# Patient Record
Sex: Female | Born: 1965 | Race: Black or African American | Hispanic: No | Marital: Married | Smoking: Never smoker
Health system: Southern US, Community
[De-identification: ages and names within clinical notes are randomized; demographics above are authoritative.]

## PROBLEM LIST (undated history)

## (undated) DIAGNOSIS — E119 Type 2 diabetes mellitus without complications: Secondary | ICD-10-CM

## (undated) DIAGNOSIS — M5416 Radiculopathy, lumbar region: Secondary | ICD-10-CM

## (undated) DIAGNOSIS — I10 Essential (primary) hypertension: Secondary | ICD-10-CM

## (undated) DIAGNOSIS — G43909 Migraine, unspecified, not intractable, without status migrainosus: Secondary | ICD-10-CM

---

## 2011-02-18 ENCOUNTER — Other Ambulatory Visit: Payer: Self-pay | Admitting: Oncology

## 2011-06-25 ENCOUNTER — Other Ambulatory Visit: Payer: Self-pay | Admitting: *Deleted

## 2011-07-01 ENCOUNTER — Other Ambulatory Visit: Payer: Self-pay | Admitting: *Deleted

## 2012-12-25 ENCOUNTER — Emergency Department (HOSPITAL_COMMUNITY)
Admission: EM | Admit: 2012-12-25 | Discharge: 2012-12-25 | Disposition: A | Discharge status: Home / Self Care | Payer: BC Managed Care – PPO | Source: Home / Self Care | Attending: Family Medicine | Admitting: Family Medicine

## 2012-12-25 ENCOUNTER — Encounter (HOSPITAL_COMMUNITY): Payer: Self-pay

## 2012-12-25 DIAGNOSIS — G43909 Migraine, unspecified, not intractable, without status migrainosus: Secondary | ICD-10-CM

## 2012-12-25 DIAGNOSIS — J069 Acute upper respiratory infection, unspecified: Secondary | ICD-10-CM

## 2012-12-25 HISTORY — DX: Migraine, unspecified, not intractable, without status migrainosus: G43.909

## 2012-12-25 LAB — POCT RAPID STREP A: Streptococcus, Group A Screen (Direct): NEGATIVE

## 2012-12-25 MED ORDER — KETOROLAC TROMETHAMINE 30 MG/ML IJ SOLN
30.0000 mg | Freq: Once | INTRAMUSCULAR | Status: AC
  Administered 2012-12-25: 30 mg via INTRAMUSCULAR

## 2012-12-25 MED ORDER — DIPHENHYDRAMINE HCL 50 MG/ML IJ SOLN
25.0000 mg | Freq: Once | INTRAMUSCULAR | Status: AC
  Administered 2012-12-25: 25 mg via INTRAMUSCULAR

## 2012-12-25 NOTE — ED Notes (Signed)
C/o URI type syx since Saturday, no relief w OTC medications; wants strep test

## 2012-12-25 NOTE — ED Provider Notes (Signed)
CSN: [id number]     Arrival date & time 12/25/12  1014 History   First MD Initiated Contact with Patient 12/25/12 1215     Chief Complaint  Patient presents with   URI   (Consider location/radiation/quality/duration/timing/severity/associated sxs/prior Treatment) HPI Comments: Pt also c/o migraine, started yesterday, took own medicine which usually solves problem; it helped for a little while then headache came back.   Patient is a 47 y.o. female presenting with URI. The history is provided by the patient.  URI Presenting symptoms: congestion, cough, rhinorrhea and sore throat   Presenting symptoms: no ear pain and no fever   Severity:  Moderate Onset quality:  Gradual Duration:  2 days Timing:  Constant Progression:  Unchanged Chronicity:  New Relieved by:  Nothing Worsened by:  Nothing tried Ineffective treatments:  OTC medications Associated symptoms: headaches     Past Medical History  Diagnosis Date   Migraine    History reviewed. No pertinent past surgical history. History reviewed. No pertinent family history. History  Substance Use Topics   Smoking status: Never Smoker    Smokeless tobacco: Not on file   Alcohol Use: No   OB History   Grav Para Term Preterm Abortions TAB SAB Ect Mult Living                 Review of Systems  Constitutional: Positive for chills. Negative for fever.  HENT: Positive for congestion, sore throat and rhinorrhea. Negative for ear pain.   Eyes: Positive for photophobia.  Respiratory: Positive for cough. Negative for shortness of breath.   Neurological: Positive for headaches.    Allergies  Review of patient's allergies indicates not on file.  Home Medications   Current Outpatient Rx  Name  Route  Sig  Dispense  Refill   rizatriptan (MAXALT-MLT) 10 MG disintegrating tablet   Oral   Take 10 mg by mouth as needed for migraine. May repeat in 2 hours if needed          BP 132/79   Pulse 74   Temp(Src) 97.5 F (36.4  C) (Oral)   Resp 18   SpO2 100% Physical Exam  Constitutional: She appears well-developed and well-nourished.  Appears uncomfortable, sitting in dark.   HENT:  Right Ear: Tympanic membrane, external ear and ear canal normal.  Left Ear: Tympanic membrane, external ear and ear canal normal.  Nose: Mucosal edema and rhinorrhea present.  Mouth/Throat: Posterior oropharyngeal edema and posterior oropharyngeal erythema present. No oropharyngeal exudate.  Cerumen in both ear canals.   Cardiovascular: Normal rate and regular rhythm.   Pulmonary/Chest: Effort normal and breath sounds normal.  Lymphadenopathy:       Head (right side): No submental, no submandibular and no tonsillar adenopathy present.       Head (left side): No submental, no submandibular and no tonsillar adenopathy present.    She has no cervical adenopathy.    ED Course  Procedures (including critical care time) Labs Review Labs Reviewed  POCT RAPID STREP A ([facility] CARE ONLY)   Imaging Review No results found.  MDM   1. Migraine   2. URI (upper respiratory infection)    Given self care instructions for URI. Given toradol 30mg  IM and benadryl 25mg  IM here at [facility] for migraine.     Cathlyn Parsons, NP 12/25/12 [phone]

## 2012-12-26 NOTE — ED Provider Notes (Signed)
Medical screening examination/treatment/procedure(s) were performed by a resident physician or non-physician practitioner and as the supervising physician I was immediately available for consultation/collaboration.  Clementeen Graham, MD    Rodolph Bong, MD 12/26/12 [phone]

## 2012-12-27 LAB — CULTURE, GROUP A STREP

## 2021-03-18 ENCOUNTER — Emergency Department: Payer: BC Managed Care – PPO

## 2021-03-18 ENCOUNTER — Other Ambulatory Visit: Payer: Self-pay

## 2021-03-18 ENCOUNTER — Emergency Department
Admission: EM | Admit: 2021-03-18 | Discharge: 2021-03-18 | Disposition: A | Discharge status: Home / Self Care | Payer: BC Managed Care – PPO | Attending: Emergency Medicine | Admitting: Emergency Medicine

## 2021-03-18 ENCOUNTER — Encounter: Payer: Self-pay | Admitting: Emergency Medicine

## 2021-03-18 DIAGNOSIS — Z91040 Latex allergy status: Secondary | ICD-10-CM | POA: Insufficient documentation

## 2021-03-18 DIAGNOSIS — K2100 Gastro-esophageal reflux disease with esophagitis, without bleeding: Secondary | ICD-10-CM | POA: Insufficient documentation

## 2021-03-18 LAB — CBC
HCT: 40.5 % (ref 36.0–46.0)
Hemoglobin: 13.1 g/dL (ref 12.0–15.0)
MCH: 27.6 pg (ref 26.0–34.0)
MCHC: 32.3 g/dL (ref 30.0–36.0)
MCV: 85.4 fL (ref 80.0–100.0)
Platelets: 715 10*3/uL — ABNORMAL HIGH (ref 150–400)
RBC: 4.74 MIL/uL (ref 3.87–5.11)
RDW: 14.3 % (ref 11.5–15.5)
WBC: 10.6 10*3/uL — ABNORMAL HIGH (ref 4.0–10.5)
nRBC: 0 % (ref 0.0–0.2)

## 2021-03-18 LAB — HEPATIC FUNCTION PANEL
ALT: 36 U/L (ref 0–44)
AST: 33 U/L (ref 15–41)
Albumin: 4.1 g/dL (ref 3.5–5.0)
Alkaline Phosphatase: 120 U/L (ref 38–126)
Bilirubin, Direct: 0.1 mg/dL (ref 0.0–0.2)
Indirect Bilirubin: 0.5 mg/dL (ref 0.3–0.9)
Total Bilirubin: 0.6 mg/dL (ref 0.3–1.2)
Total Protein: 8.5 g/dL — ABNORMAL HIGH (ref 6.5–8.1)

## 2021-03-18 LAB — BASIC METABOLIC PANEL
Anion gap: 10 (ref 5–15)
BUN: 16 mg/dL (ref 6–20)
CO2: 22 mmol/L (ref 22–32)
Calcium: 10.4 mg/dL — ABNORMAL HIGH (ref 8.9–10.3)
Chloride: 101 mmol/L (ref 98–111)
Creatinine, Ser: 1.02 mg/dL — ABNORMAL HIGH (ref 0.44–1.00)
GFR, Estimated: >60 mL/min (ref >60)
Glucose, Bld: 133 mg/dL — ABNORMAL HIGH (ref 70–99)
Potassium: 4 mmol/L (ref 3.5–5.1)
Sodium: 133 mmol/L — ABNORMAL LOW (ref 135–145)

## 2021-03-18 LAB — TROPONIN I (HIGH SENSITIVITY)
Troponin I (High Sensitivity): 5 ng/L (ref <18)
Troponin I (High Sensitivity): 5 ng/L (ref <18)

## 2021-03-18 LAB — LIPASE, BLOOD: Lipase: 23 U/L (ref 11–51)

## 2021-03-18 IMAGING — CT CT ABD-PELV W/ CM
2 of 5 series · 16 of 46 positions shown, 18 images · IV contrast (APPLIED)
Comparison: None.

CLINICAL DATA: Generalized abdominal pain with nausea and vomiting

EXAM:
CT ABDOMEN AND PELVIS WITH CONTRAST
TECHNIQUE: Multidetector CT imaging of the abdomen and pelvis was performed
using the standard protocol following bolus administration of
intravenous contrast.
CONTRAST:  100mL OMNIPAQUE IOHEXOL 300 MG/ML  SOLN

[Series 2: routine abd/pel with · axial · 0.93mm/px · z∈[-1134,-688]mm · 13 of 101 slices shown, 15 images]
[im 6/101  soft-tissue]
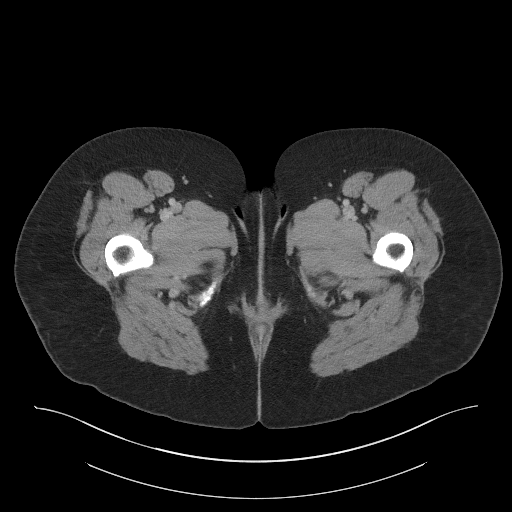
[im 6/101  bone]
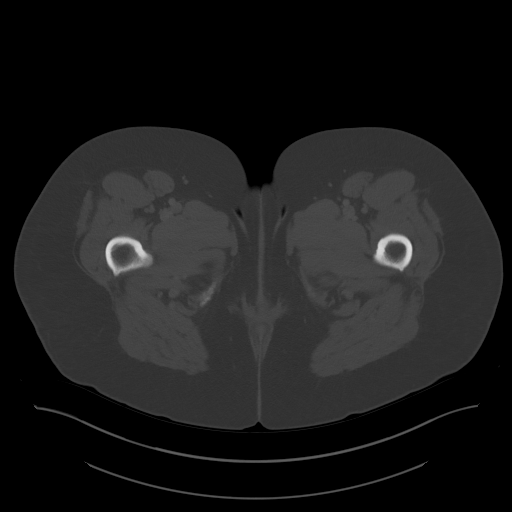
[im 16/101  soft-tissue]
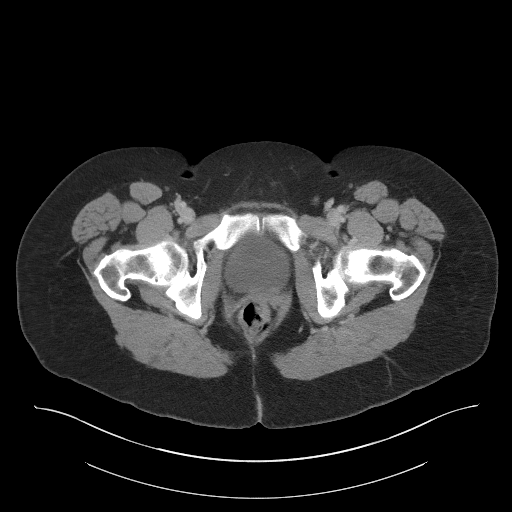
[im 22/101  soft-tissue]
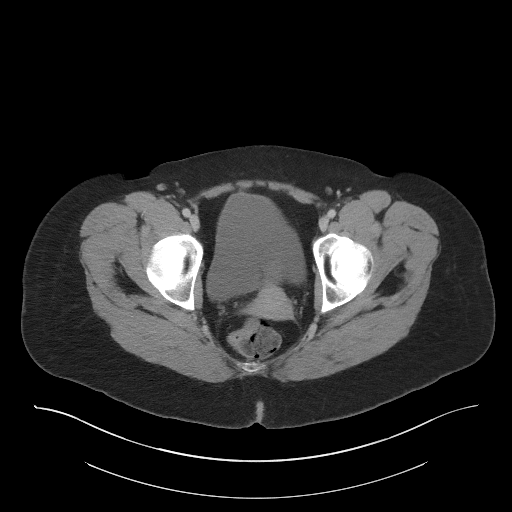
[im 27/101  soft-tissue]
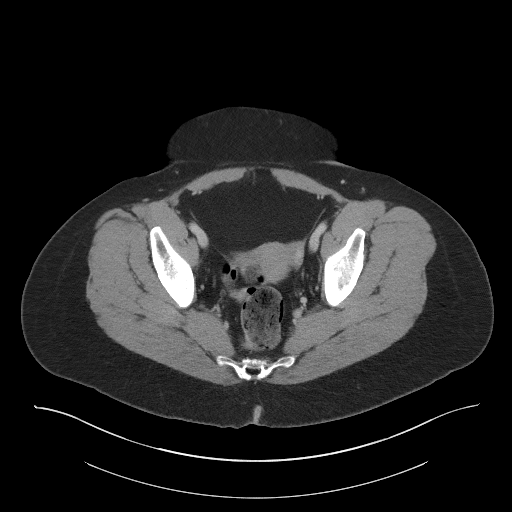
[im 37/101  soft-tissue]
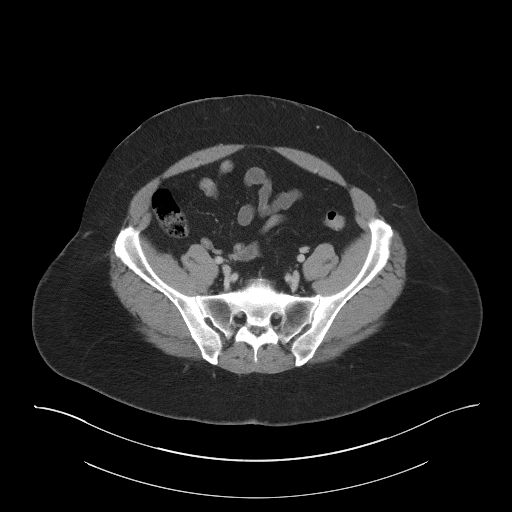
[im 43/101  soft-tissue]
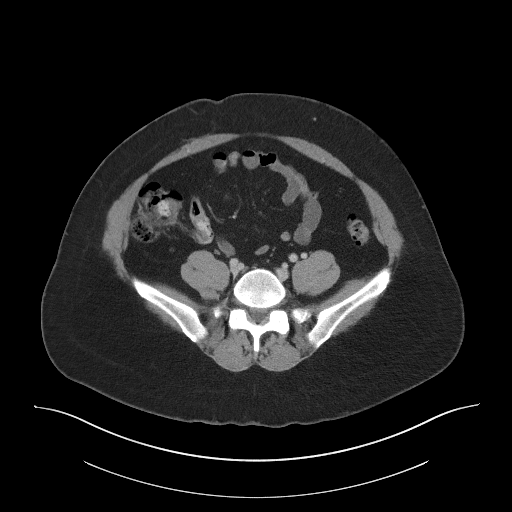
[im 53/101  soft-tissue]
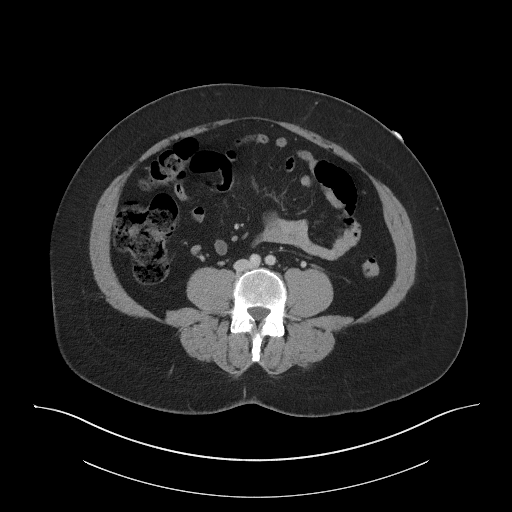
[im 58/101  soft-tissue]
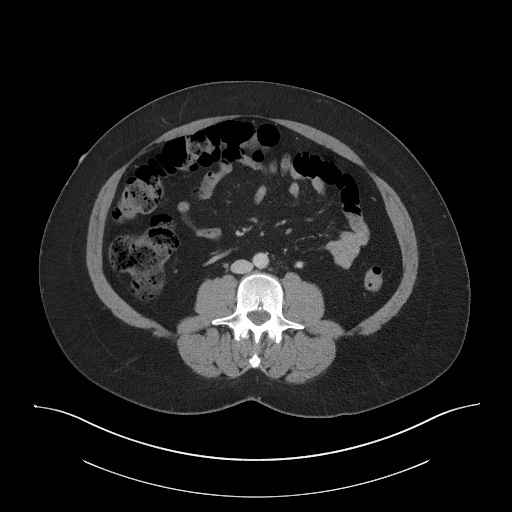
[im 64/101  soft-tissue]
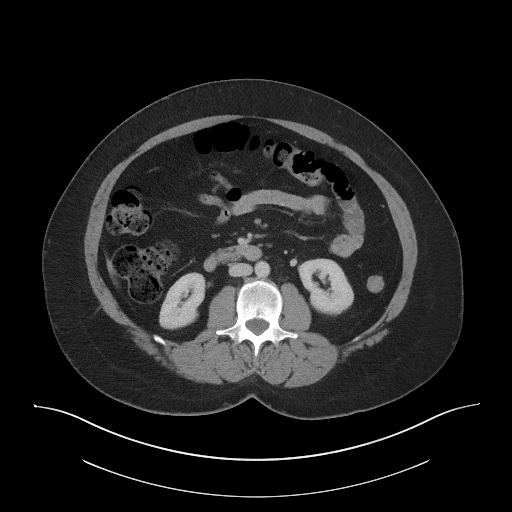
[im 64/101  bone]
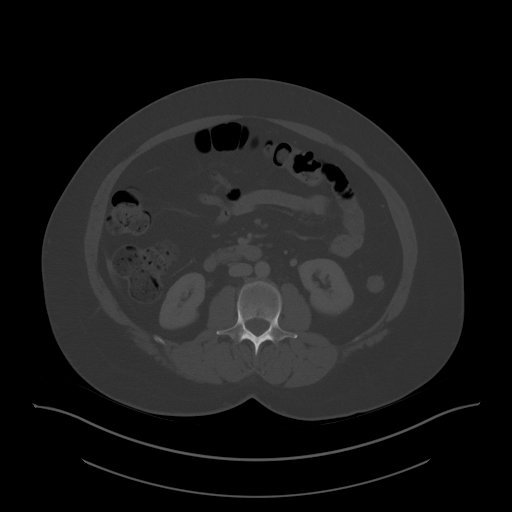
[im 74/101  soft-tissue]
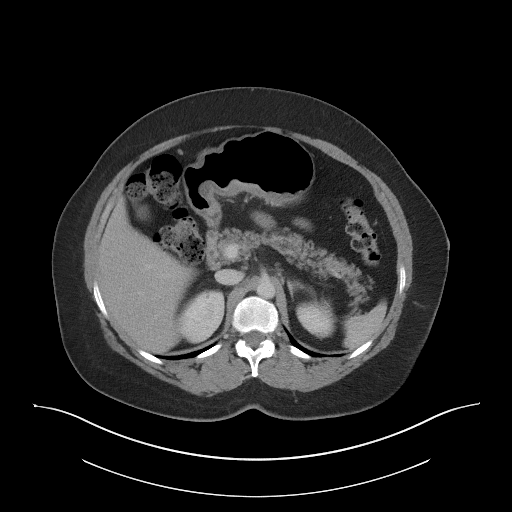
[im 79/101  soft-tissue]
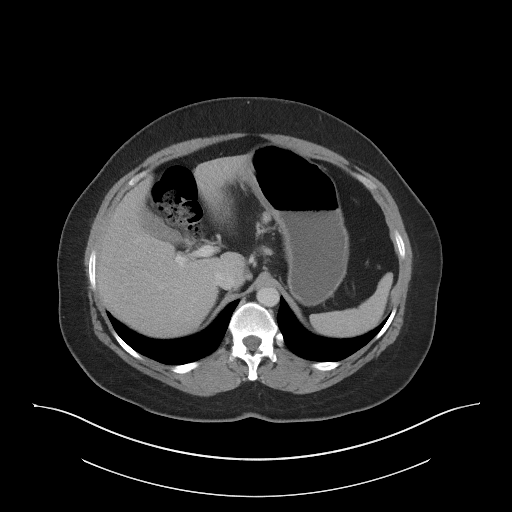
[im 85/101  soft-tissue]
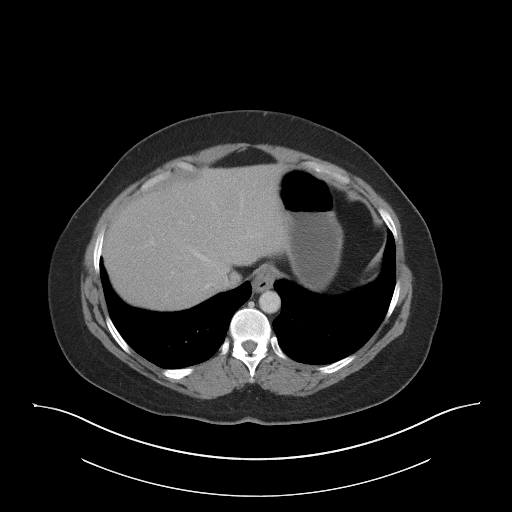
[im 95/101  soft-tissue]
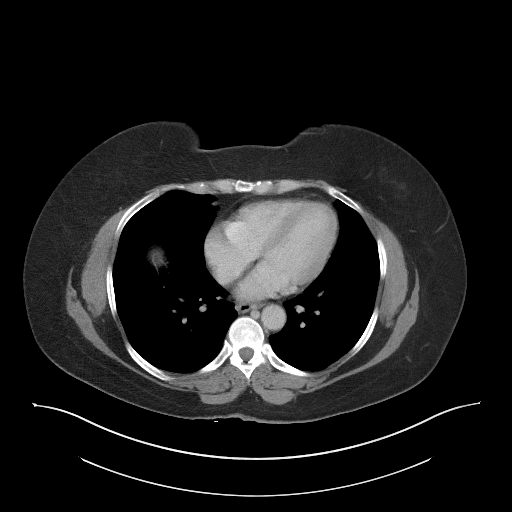

[Series 5: coronal st · coronal · 0.89mm/px · 3 of 101 slices shown]
[im 34/101  soft-tissue]
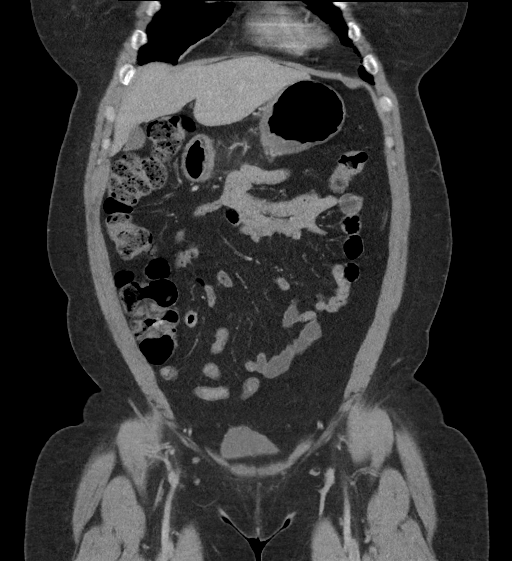
[im 45/101  soft-tissue]
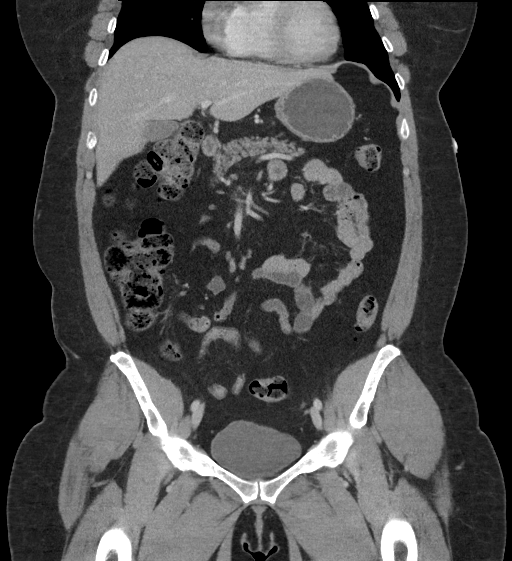
[im 56/101  soft-tissue]
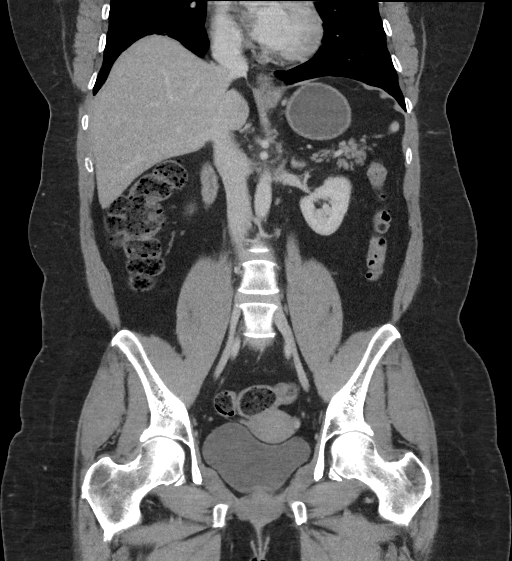

[16 of 46 positions shown; findings below may reference images not displayed]

FINDINGS: Lower chest: No acute abnormality.

Hepatobiliary: No suspicious hepatic lesion. Gallbladder is
unremarkable. No biliary ductal dilation.

Pancreas: No pancreatic ductal dilation or evidence of acute
inflammation.

Spleen: Normal in size without focal abnormality.

Adrenals/Urinary Tract: Adrenal glands are unremarkable. Kidneys are
normal, without renal calculi, solid enhancing lesion, or
hydronephrosis. Bladder is unremarkable.

Stomach/Bowel: No enteric contrast was administered. Mild symmetric
wall thickening of the distal esophagus which may reflect
esophagitis. Small hiatal hernia otherwise the stomach is
unremarkable for degree of distension. No pathologic dilation of
small or large bowel. Terminal ileum is unremarkable. The appendix
is not confidently identified however there is no pericecal
inflammation. Colonic diverticulosis without findings of acute
diverticulitis.

Vascular/Lymphatic: Scattered aortic and branch vessel
atherosclerosis without abdominal aortic aneurysm. No pathologically
enlarged abdominal or pelvic lymph nodes.

Reproductive: Uterus and bilateral adnexa are unremarkable.

Other: No significant abdominopelvic free fluid. No
pneumoperitoneum.

Musculoskeletal: No acute or significant osseous findings.
IMPRESSION: 1. Mild symmetric wall thickening of the distal esophagus may
reflect esophagitis.
2. Small hiatal hernia.
3. Colonic diverticulosis without findings of acute diverticulitis.
4.  Aortic Atherosclerosis ([CS]-[CS]).

## 2021-03-18 MED ORDER — LIDOCAINE VISCOUS HCL 2 % MT SOLN: 15.0000 mL | Freq: Two times a day (BID) | OROMUCOSAL | 2 refills | Status: DC | PRN

## 2021-03-18 MED ORDER — SODIUM CHLORIDE 0.9 % IV BOLUS
1000.0000 mL | Freq: Once | INTRAVENOUS | Status: AC
  Administered 2021-03-18: 1000 mL via INTRAVENOUS

## 2021-03-18 MED ORDER — ONDANSETRON HCL 4 MG/2ML IJ SOLN
4.0000 mg | Freq: Once | INTRAMUSCULAR | Status: AC
  Administered 2021-03-18: 4 mg via INTRAVENOUS
  Filled 2021-03-18: qty 2

## 2021-03-18 MED ORDER — MORPHINE SULFATE (PF) 4 MG/ML IV SOLN
4.0000 mg | Freq: Once | INTRAVENOUS | Status: AC
  Administered 2021-03-18: 4 mg via INTRAVENOUS
  Filled 2021-03-18: qty 1

## 2021-03-18 MED ORDER — LIDOCAINE VISCOUS HCL 2 % MT SOLN
15.0000 mL | Freq: Once | OROMUCOSAL | Status: AC
  Administered 2021-03-18: 15 mL via ORAL
  Filled 2021-03-18: qty 15

## 2021-03-18 MED ORDER — PANTOPRAZOLE SODIUM 40 MG PO TBEC
40.0000 mg | DELAYED_RELEASE_TABLET | Freq: Once | ORAL | Status: AC
  Administered 2021-03-18: 40 mg via ORAL
  Filled 2021-03-18: qty 1

## 2021-03-18 MED ORDER — IOHEXOL 300 MG/ML  SOLN
100.0000 mL | Freq: Once | INTRAMUSCULAR | Status: AC | PRN
  Administered 2021-03-18: 100 mL via INTRAVENOUS
  Filled 2021-03-18: qty 100

## 2021-03-18 MED ORDER — FAMOTIDINE IN NACL 20-0.9 MG/50ML-% IV SOLN
20.0000 mg | Freq: Once | INTRAVENOUS | Status: AC
  Administered 2021-03-18: 20 mg via INTRAVENOUS
  Filled 2021-03-18: qty 50

## 2021-03-18 MED ORDER — ALUM & MAG HYDROXIDE-SIMETH 200-200-20 MG/5ML PO SUSP
30.0000 mL | Freq: Once | ORAL | Status: AC
  Administered 2021-03-18: 30 mL via ORAL
  Filled 2021-03-18: qty 30

## 2021-03-18 MED ORDER — OMEPRAZOLE 10 MG PO CPDR: 10.0000 mg | DELAYED_RELEASE_CAPSULE | Freq: Every day | ORAL | 6 refills | Status: DC

## 2021-03-18 MED ORDER — FAMOTIDINE 20 MG PO TABS: 20.0000 mg | ORAL_TABLET | Freq: Every day | ORAL | 0 refills | Status: DC

## 2021-03-18 NOTE — ED Triage Notes (Signed)
Pt comes into the ED via POV c/o generalized abd pain, N/V that started last Thursday.  Pt in NAD at this time.

## 2021-03-18 NOTE — ED Provider Notes (Signed)
[facility] Emergency Department Provider Note  ____________________________________________  Time seen: Approximately 7:22 PM  I have reviewed the triage vital signs and the nursing notes.   HISTORY  Chief Complaint Abdominal Pain and Emesis    HPI Kelly Long is a 55 y.o. female who presents to the ED for sharp epigastric abdominal pain, nausea and vomiting.  Symptoms have been ongoing x7 days.  No fevers, congestion, sore throat.  Patient is having sharp abdominal pain.  She still has her gallbladder but does not have her appendix.  No history of GI issues to include IBS, Crohn's or colitis.  Patient states that she is vomiting up to 10 times a day.       Past Medical History:  Diagnosis Date   Migraine     There are no problems to display for this patient.   History reviewed. No pertinent surgical history.  Prior to Admission medications   Medication Sig Start Date End Date Taking? Authorizing Provider  famotidine (PEPCID) 20 MG tablet Take 1 tablet (20 mg total) by mouth daily. 03/18/21 03/18/22 Yes Keriana Sarsfield, Delorise Royals, PA-C  GI Cocktail (alum & mag hydroxide-simethicone/lidocaine)oral mixture Take 15 mLs by mouth 2 (two) times daily as needed (heartburn). 03/18/21  Yes Honor Fairbank, Delorise Royals, PA-C  omeprazole (PRILOSEC) 10 MG capsule Take 1 capsule (10 mg total) by mouth daily. 03/18/21  Yes Jovanna Hodges, Delorise Royals, PA-C  rizatriptan (MAXALT-MLT) 10 MG disintegrating tablet Take 10 mg by mouth as needed for migraine. May repeat in 2 hours if needed    [provider]    Allergies Latex and Shellfish allergy  History reviewed. No pertinent family history.  Social History Social History   Tobacco Use   Smoking status: Never  Substance Use Topics   Alcohol use: No     Review of Systems  Constitutional: No fever/chills Eyes: No visual changes. No discharge ENT: No upper respiratory complaints. Cardiovascular: no chest  pain. Respiratory: no cough. No SOB. Gastrointestinal: Sharp epigastric abdominal pain with nausea and vomiting.  No diarrhea.  No constipation. Genitourinary: Negative for dysuria. No hematuria Musculoskeletal: Negative for musculoskeletal pain. Skin: Negative for rash, abrasions, lacerations, ecchymosis. Neurological: Negative for headaches, focal weakness or numbness.  10 System ROS otherwise negative.  ____________________________________________   PHYSICAL EXAM:  VITAL SIGNS: ED Triage Vitals  Enc Vitals Group     BP 03/18/21 1351 (!) 170/97     Pulse Rate 03/18/21 1351 (!) 124     Resp 03/18/21 1351 20     Temp 03/18/21 1351 98.6 F (37 C)     Temp Source 03/18/21 1351 Oral     SpO2 03/18/21 1351 99 %     Weight 03/18/21 1338 223 lb (101.2 kg)     Height 03/18/21 1338 5\' 4"  (1.626 m)     Head Circumference --      Peak Flow --      Pain Score 03/18/21 1337 9     Pain Loc --      Pain Edu? --      Excl. in GC? --      Constitutional: Alert and oriented. Well appearing and in no acute distress. Eyes: Conjunctivae are normal. PERRL. EOMI. Head: Atraumatic. ENT:      Ears:       Nose: No congestion/rhinnorhea.      Mouth/Throat: Mucous membranes are moist.  Neck: No stridor.   Cardiovascular: Normal rate, regular rhythm. Normal S1 and S2.  Good peripheral circulation.  Respiratory: Normal respiratory effort without tachypnea or retractions. Lungs CTAB. Good air entry to the bases with no decreased or absent breath sounds. Gastrointestinal: Bowel sounds 4 quadrants.  Soft to palpation all quadrant.  Patient is very tender to palpation in the epigastric and right upper quadrants.  Guarding in this region.  Positive for Murphy sign.. No guarding or rigidity. No palpable masses. No distention. No CVA tenderness. Musculoskeletal: Full range of motion to all extremities. No gross deformities appreciated. Neurologic:  Normal speech and language. No gross focal neurologic  deficits are appreciated.  Skin:  Skin is warm, dry and intact. No rash noted. Psychiatric: Mood and affect are normal. Speech and behavior are normal. Patient exhibits appropriate insight and judgement.   ____________________________________________   LABS (all labs ordered are listed, but only abnormal results are displayed)  Labs Reviewed  BASIC METABOLIC PANEL - Abnormal; Notable for the following components:      Result Value   Sodium 133 (*)    Glucose, Bld 133 (*)    Creatinine, Ser 1.02 (*)    Calcium 10.4 (*)    All other components within normal limits  CBC - Abnormal; Notable for the following components:   WBC 10.6 (*)    Platelets 715 (*)    All other components within normal limits  HEPATIC FUNCTION PANEL - Abnormal; Notable for the following components:   Total Protein 8.5 (*)    All other components within normal limits  LIPASE, BLOOD  POC URINE PREG, ED  TROPONIN I (HIGH SENSITIVITY)  TROPONIN I (HIGH SENSITIVITY)   ____________________________________________  EKG   ____________________________________________  RADIOLOGY I personally viewed and evaluated these images as part of my medical decision making, as well as reviewing the written report by the radiologist.  ED Provider Interpretation: Distal esophageal irritation, no other significant findings concerning for infection or obstruction on CT scan.  Small hiatal hernia.  CT ABDOMEN PELVIS W CONTRAST  Result Date: 03/18/2021 CLINICAL DATA:  Generalized abdominal pain with nausea and vomiting EXAM: CT ABDOMEN AND PELVIS WITH CONTRAST TECHNIQUE: Multidetector CT imaging of the abdomen and pelvis was performed using the standard protocol following bolus administration of intravenous contrast. CONTRAST:  160mL OMNIPAQUE IOHEXOL 300 MG/ML  SOLN COMPARISON:  None. FINDINGS: Lower chest: No acute abnormality. Hepatobiliary: No suspicious hepatic lesion. Gallbladder is unremarkable. No biliary ductal dilation.  Pancreas: No pancreatic ductal dilation or evidence of acute inflammation. Spleen: Normal in size without focal abnormality. Adrenals/Urinary Tract: Adrenal glands are unremarkable. Kidneys are normal, without renal calculi, solid enhancing lesion, or hydronephrosis. Bladder is unremarkable. Stomach/Bowel: No enteric contrast was administered. Mild symmetric wall thickening of the distal esophagus which may reflect esophagitis. Small hiatal hernia otherwise the stomach is unremarkable for degree of distension. No pathologic dilation of small or large bowel. Terminal ileum is unremarkable. The appendix is not confidently identified however there is no pericecal inflammation. Colonic diverticulosis without findings of acute diverticulitis. Vascular/Lymphatic: Scattered aortic and branch vessel atherosclerosis without abdominal aortic aneurysm. No pathologically enlarged abdominal or pelvic lymph nodes. Reproductive: Uterus and bilateral adnexa are unremarkable. Other: No significant abdominopelvic free fluid. No pneumoperitoneum. Musculoskeletal: No acute or significant osseous findings. IMPRESSION: 1. Mild symmetric wall thickening of the distal esophagus may reflect esophagitis. 2. Small hiatal hernia. 3. Colonic diverticulosis without findings of acute diverticulitis. 4.  Aortic Atherosclerosis (ICD10-I70.0). Electronically Signed   By: Dahlia Bailiff M.D.   On: 03/18/2021 20:04    ____________________________________________    PROCEDURES  Procedure(s) performed:  Procedures    Medications  famotidine (PEPCID) IVPB 20 mg premix (20 mg Intravenous New Bag/Given 03/18/21 2026)  ondansetron (ZOFRAN) injection 4 mg (4 mg Intravenous Given 03/18/21 1939)  morphine 4 MG/ML injection 4 mg (4 mg Intravenous Given 03/18/21 1939)  sodium chloride 0.9 % bolus 1,000 mL (1,000 mLs Intravenous New Bag/Given 03/18/21 1940)  iohexol (OMNIPAQUE) 300 MG/ML solution 100 mL (100 mLs Intravenous Contrast Given  03/18/21 1951)  alum & mag hydroxide-simeth (MAALOX/MYLANTA) 200-200-20 MG/5ML suspension 30 mL (30 mLs Oral Given 03/18/21 2025)    And  lidocaine (XYLOCAINE) 2 % viscous mouth solution 15 mL (15 mLs Oral Given 03/18/21 2025)  pantoprazole (PROTONIX) EC tablet 40 mg (40 mg Oral Given 03/18/21 2025)     ____________________________________________   INITIAL IMPRESSION / ASSESSMENT AND PLAN / ED COURSE  Pertinent labs & imaging results that were available during my care of the patient were reviewed by me and considered in my medical decision making (see chart for details).  Review of the [place] CSRS was performed in accordance of the [place] prior to dispensing any controlled drugs.           Patient's diagnosis is consistent with GERD/gastritis.  Patient presented with sharp epigastric pain, nausea vomiting times a week.  No fevers.  No bilious vomit.  No diarrhea or constipation.  Patient was tender in the epigastric region.  Labs are overall reassuring.  No elevation in lipase or LFTs.  CT scan revealed distal esophageal irritation, small hiatal hernia.  I will treat symptomatically with famotidine, Protonix and GI cocktail.. Patient will be discharged home with prescriptions for GI cocktail, Protonix, famotidine.  If symptoms do not improve follow-up primary care or GI.Marland Kitchen  Patient is given ED precautions to return to the ED for any worsening or new symptoms.     ____________________________________________  FINAL CLINICAL IMPRESSION(S) / ED DIAGNOSES  Final diagnoses:  Gastroesophageal reflux disease with esophagitis without hemorrhage      NEW MEDICATIONS STARTED DURING THIS VISIT:  ED Discharge Orders          Ordered    omeprazole (PRILOSEC) 10 MG capsule  Daily        03/18/21 2031    famotidine (PEPCID) 20 MG tablet  Daily        03/18/21 2031    GI Cocktail (alum & mag hydroxide-simethicone/lidocaine)oral mixture  2 times daily PRN        03/18/21 2031                 This chart was dictated using voice recognition software/Dragon. Despite best efforts to proofread, errors can occur which can change the meaning. Any change was purely unintentional.    Darletta Moll, PA-C 03/18/21 2031    Vanessa Bennett, MD 03/18/21 2126

## 2022-02-24 NOTE — Progress Notes (Signed)
 Outpatient Neurology Clinic Note   [facility] [address] [facility] NEUROLOGY CLINIC [address] [place] [address] [place] [address] [phone] Date: 02/23/2022 Patient Name: Kelly Long MRN: [id number] PCP: Joyceann Michal JONELLE Joyceann Michal JONELLE, MD   Assessment and Plan   Kelly Long is a 56 y.o. female with a past medical history of GERD, OSA, and arthritis presenting in follow-up for evaluation and treatments of headaches.  # Tension headaches  Ice Pick headaches Intractable, daily, tension-type headaches involving bitemporal, band-like pain and pressure. Tension headaches also have migrainous features at times with photophobia, phonophobia, and nausea. Also has ice pick, stabbing headaches. Suspect a component of rebound headaches given daily Excedrin use. Referral was sent to the [facility] given intractable headaches while on: amitriptyline 100 mg nightly, indomethacin 75 mg BID, Tizanidine 8 mg nightly, Verapamil 120 mg, and PRN Imitrex  50 mg.   Plan:  - Referral sent to [facility]  - Please avoid using Excedrin or other over the counter medications to prevent rebound headaches - Discontinue Imitrex  and start using Ubrelvy PRN for abortive therapy (do not exceed more than 200 mg in one day) - Take Reglan PRN for nausea  - Continue to take amitriptyline 100 mg nightly, indomethacin 75 mg BID, Tizanidine 8 mg nightly, Verapamil 120 mg as previously prescribed   Return Visit Discussed: Return in about 3 months (around 05/26/2022).  This patient was discussed with Dr. Marci who agrees with the above assessment and plan.  I personally spent 45 minutes face-to-face and non-face-to-face in the care of this patient, which includes all pre, intra, and post visit time on the date of service.  Louretta Shearing, MD PGY-2, Neurology [facility] Department of Neurology   HPI     HPI: Kelly Long is a 56 y.o. female seen  in consultation at the [facility] at the request of Dr. Joyceann for evaluation of headaches. They are requesting my opinion regarding potential etiologies, diagnostic work-up that should be considered, and/or treatment options entertained.   She was previously seen by Dr. Gordy and prescribed the following medications: amitriptyline 100 mg nightly, indomethacin 75 mg BID, Tizanidine 8 mg nightly, Verapamil 120 mg, and PRN Imitrex  50 mg. Additionally, she uses tylenol  and ibuprofen daily. Despite use of the above medications, she has daily headaches with worsening in the afternoon. Daily headaches involve the entire head, more prominent in the bitemporal region and are band-like. These headaches are associated with photophobia and phonophobia. 2-3 times per week, she also experiences nausea. These headaches also get severe 3-4 times a month and become intolerable.   In addition to the headache type above, she also has ice pick headaches, which she describes as stabbing headaches. She gets these headaches 3-4 times a months  Allergies  Allergen Reactions  . Latex Anaphylaxis  . Shellfish Containing Products Anaphylaxis     Current Outpatient Medications  Medication Sig Dispense Refill  . amitriptyline (ELAVIL) 50 MG tablet Take 2 tablets (100 mg total) by mouth nightly. 60 tablet 0  . indomethacin (INDOCIN SR) 75 mg CR capsule TAKE 1 CAP BY MOUTH 3 TIMES A DAY WITH MEALS 5-6 HOURS APART    . lidocaine  2% viscous (LIDOCAINE ) 2 % Soln 5 mL by Mouth route every four (4) hours as needed (spasms, chest pain). 300 mL 2  . multivit,calc,mins/iron/folic (WOMEN'S ONE DAILY ORAL) Take by mouth.    . omeprazole  (PRILOSEC) 40  MG capsule Take 1 capsule (40 mg total) by mouth daily. TAKE 1 CAPSULE (40 MG TOTAL) BY MOUTH DAILY.    . tizanidine (ZANAFLEX) 4 MG tablet Take 2 tablets (8 mg total) by mouth nightly. 60 tablet 0  . topiramate (TOPAMAX) 50 MG tablet Take 3  tablets (150 mg total) by mouth daily. 90 tablet 3  . verapamiL (CALAN-SR) 120 MG CR tablet TAKE 1 TABLET BY MOUTH TWICE A DAY 180 tablet 2  . fexofenadine (ALLEGRA) 180 MG tablet Take 1 tablet (180 mg total) by mouth daily.    . metoclopramide (REGLAN) 10 MG tablet Take 1 tablet (10 mg total) by mouth Three (3) times a day with a meal. 90 tablet 1  . ubrogepant (UBRELVY) 50 mg tablet Take 1 tablet (50 mg total) by mouth two (2) times a day as needed. 50 to 100 mg as a single dose; if symptoms persist or return, may repeat dose after =2 hours. Maximum: 200 mg per 24 hours 25 tablet 0   No current facility-administered medications for this visit.    Past Medical History:  Diagnosis Date  . Arthritis    right knee left shoulder and lower back  . GERD (gastroesophageal reflux disease)   . Migraine   . Migraine   . Nipple dermatitis 05/04/2015  . Rib pain 08/15/2013  . Snores     Past Surgical History:  Procedure Laterality Date  . APPENDECTOMY    . HYSTERECTOMY  1992   partial   . PR ALLOGRAFT FOR SPINE SURGERY ONLY MORSELIZED Midline 08/28/2015   Procedure: Allograft For Spine Surgery Only; Morselized;  Surgeon: Toribio Jama Clap, MD;  Location: MAIN OR [facility];  Service: Orthopedics  . PR ANTERIOR INSTRUMENTATION 2-3 VERTEBRAL SEGMENTS Midline 08/28/2015   Procedure: Ant Instrum; 2 To 3 Verteb Segmt Cervical;  Surgeon: Toribio Jama Clap, MD;  Location: MAIN OR [facility];  Service: Orthopedics  . PR ARTHRODESIS ANT INTERBODY INC DISCECTOMY, CERVICAL BELOW C2 Midline 08/28/2015   Procedure: Arthrodes, Ant Intrbdy, Incl Disc Spc Prep, Discect, Osteophyt/Decompress Spinl Crd &/Or Nrv Rt, Crv Blo C2;  Surgeon: Toribio Jama Clap, MD;  Location: MAIN OR [facility];  Service: Orthopedics  . PR ARTHRODESIS ANT INTERBODY INC DISCECTOMY, CERVICAL BELOW C2 EACH ADDL Midline 08/28/2015   Procedure: Arthrod, Ant Intbdy, Incl Disc Spc Prep/Disctmy/Ostephyt/Decmpr Spnl Crd/Nrv Rt; Cerv Belo C2, Ea Add`L Spc;   Surgeon: Toribio Jama Clap, MD;  Location: MAIN OR [facility];  Service: Orthopedics  . PR AUTOGRAFT SPINE SURGERY LOCAL FROM SAME INCISION Midline 08/28/2015   Procedure: Autograft/Spine Surg Only (W/Harvest Graft); Local (Eg, Rib/Spinous Proc, Lam Frgmt) Obtain From Same Incis;  Surgeon: Toribio Jama Clap, MD;  Location: MAIN OR [facility];  Service: Orthopedics  . PR COLONOSCOPY FLX DX W/COLLJ SPEC WHEN PFRMD Left 05/19/2015   Procedure: COLONOSCOPY, FLEXIBLE, PROXIMAL TO SPLENIC FLEXURE; DIAGNOSTIC, W/WO COLLECTION SPECIMEN BY BRUSH OR WASH;  Surgeon: Rodger Phlegm, MD;  Location: [facility] PROCEDURES [facility];  Service: Gastroenterology  . PR COLONOSCOPY FLX DX W/COLLJ SPEC WHEN PFRMD N/A 05/28/2021   Procedure: COLONOSCOPY, FLEXIBLE, PROXIMAL TO SPLENIC FLEXURE; DIAGNOSTIC, W/WO COLLECTION SPECIMEN BY BRUSH OR WASH;  Surgeon: Luke Cory Notch, MD;  Location: GI PROCEDURES [facility];  Service: Gastroenterology  . PR INSJ BIOMCHN DEV INTERVERTEBRAL DSC SPC W/ARTHRD N/A 08/28/2015   Procedure: Insert Interbody Biomechanical Device(S) With Integral Anterior Instrument For Device Anchoring, When Performed, To Intervertebral Disc Space In Conjunction With Interbody Arthrodesis, Each Interspace x2;  Surgeon: Toribio Jama Clap, MD;  Location: MAIN OR [facility];  Service: Orthopedics  . PR IONM 1 ON 1 IN OR W/ATTENDANCE EACH 15 MINUTES N/A 08/28/2015   Procedure: Continuous Intraoperative Neurophysiology Monitoring In Or;  Surgeon: Toribio Jama Clap, MD;  Location: MAIN OR [facility];  Service: Orthopedics  . PR MICROSURG TECHNIQUES,REQ OPER MICROSCOPE  08/28/2015   Procedure: Microsurgical Techniques, Requiring Use Of Operating Microscope (List Separately In Addition To Code For Primary Procedure);  Surgeon: Toribio Jama Clap, MD;  Location: MAIN OR [facility];  Service: Orthopedics  . PR UPPER GI ENDOSCOPY,BIOPSY N/A 05/28/2021   Procedure: UGI ENDOSCOPY; WITH BIOPSY, SINGLE OR MULTIPLE;  Surgeon: Luke Cory Notch, MD;   Location: GI PROCEDURES [facility];  Service: Gastroenterology  . TUBAL LIGATION      Social History   Socioeconomic History  . Marital status: Married    Spouse name: Machell Wirthlin  . Number of children: None  . Years of education: None  . Highest education level: None  Occupational History  . Occupation: Haematologist   Tobacco Use  . Smoking status: Never  . Smokeless tobacco: Never  Substance and Sexual Activity  . Alcohol use: No  . Drug use: No  Other Topics Concern  . Exercise No  . Living Situation No  Social History Narrative   Works at [employer] for OR, married with 3 kids, multple grandkids, husband is a education officer, environmental.    Social Determinants of Health   Financial Resource Strain: Medium Risk (10/07/2020)   Overall Financial Resource Strain (CARDIA)   . Difficulty of Paying Living Expenses: Somewhat hard  Food Insecurity: Food Insecurity Present (10/07/2020)   Hunger Vital Sign   . Worried About Programme Researcher, Broadcasting/film/video in the Last Year: Often true   . Ran Out of Food in the Last Year: Often true  Transportation Needs: Unmet Transportation Needs (10/07/2020)   PRAPARE - Transportation   . Lack of Transportation (Medical): Yes   . Lack of Transportation (Non-Medical): No    Family History  Problem Relation Age of Onset  . Breast cancer Mother        late 89's  . No Known Problems Father   . Breast cancer Sister 62  . No Known Problems Daughter   . Breast cancer Maternal Grandmother   . Colorectal Cancer Maternal Grandfather   . Colon cancer Maternal Grandfather   . No Known Problems Paternal Grandmother   . Colorectal Cancer Maternal Uncle   . Colon cancer Maternal Uncle   . Breast cancer Paternal Aunt   . Breast cancer Paternal Aunt   . Ovarian cancer Niece   . Breast cancer Other   . Anesthesia problems Neg Hx   . BRCA 1/2 Neg Hx   . Cancer Neg Hx   . Endometrial cancer Neg Hx   . Esophageal cancer Neg Hx   .  Hemochromatosis Neg Hx   . Irritable bowel syndrome Neg Hx   . Liver cancer Neg Hx   . Inflammatory bowel disease Neg Hx   . Pancreatic cancer Neg Hx   . Pancreatitis Neg Hx   . Stomach cancer Neg Hx   . Ulcerative colitis Neg Hx   . Crohn's disease Neg Hx   . Cirrhosis Neg Hx   . Cholelithiasis Neg Hx   . Celiac disease Neg Hx      Review of Systems  A 10-system review of systems was conducted and was negative except as documented above in the HPI  Objective  Vital signs: BP 148/80   Pulse 95   Ht 162.6 cm (5' 4.02)   Wt (!) 106.4 kg (234 lb 9.6 oz)   BMI 40.25 kg/m    Physical Exam: General Appearance: Appears in pain  HEENT: Head is atraumatic and normocephalic. Sclera anicteric without injection. Cardiopulmonary: 2+ peripheral pulses. Normal work of breathing on room air Abdomen: Non-tender, non-distended abdomen  Extremities:No clubbing, cyanosis, or edema  Skin:  No rashes, lesions on clothed exam  Neurological Examination:  Mental Status: Alert, fully oriented, answers questions appropriately. Attention and concentration grossly intact to interview and physical. Spontaneous speech was fluent without word finding pauses, dysarthria, or paraphasic errors. Comprehension was intact to simple and multi-step commands. Memory for recent and remote events was intact.  Cranial Nerves: Visual fields intact to direct confrontation.  PERRL. Pursuit eye movements were uninterrupted with full range and without more than end-gaze nystagmus. Facial sensation intact bilaterally to light touch in all three divisions of CNV. Face symmetric at rest. Normal facial movement bilaterally, including forehead, eye closure and grimace/smile. Hearing intact to conversation. Shoulder shrug full strength bilaterally. Palate movement is symmetric. Tongue protrudes midline and tongue movements are normal.  Motor Exam: Normal bulk. No tremors, myoclonus, or other adventitious movement. 5/5  strength in all extremities proximally and distally. Pronator drift is absent.  Reflexes: Deferred.  Sensory: Sensation normal to light touch in both hands and both feet  Cerebellar/Coordination/Gait: Finger-to-nose is normal without ataxia or dysmetria bilaterally. Gait exam demonstrates normal posture, base, stride length, arm swing and turns.   Diagnostic Studies and Review of Records  Labs: Risk Stratification:  Cholesterol (mg/dL)  Date Value  96/98/7977 225 (H)   Triglycerides (mg/dL)  Date Value  96/98/7977 103   HDL (mg/dL)  Date Value  96/98/7977 61 (H)   LDL Calculated (mg/dL)  Date Value  96/98/7977 143 (H)   TSH (uIU/mL)  Date Value  02/15/2022 1.356   HGB A1C, POC (%)  Date Value  05/02/2015 5.7   Hemoglobin A1C (%)  Date Value  02/15/2022 6.2 (H)    Imaging: - MRI cervical spine:  Multilevel degenerative changes along the cervical spine, as above, most prominent at C5-6. Tornwaldt's cyst, nasopharynx

## 2022-03-23 NOTE — Progress Notes (Signed)
 Attending physician attestation:   I discussed patient 's clinical presentation and relevant parts of the history and I reviewed and agreed with resident's note above.     Brad CHRISTELLA Boards, MD  Neurology

## 2022-12-08 NOTE — H&P (Signed)
 PRE-PROCEDURE HISTORY AND PHYSICAL EXAM  Kelly Long presents for her scheduled COLONOSCOPY, FLEXIBLE, PROXIMAL TO SPLENIC FLEXURE; DIAGNOSTIC, W/WO COLLECTION SPECIMEN BY BRUSH OR WASH.   The indication for the procedure(s) is Screening for colon cancer [Z12.11].    There have been no significant recent changes in the patient's medical status.  Past Medical History:  Diagnosis Date  . Arthritis    right knee left shoulder and lower back  . GERD (gastroesophageal reflux disease)   . Migraine   . Migraine   . Nipple dermatitis 05/04/2015  . Rib pain 08/15/2013  . Snores    Past Surgical History:  Procedure Laterality Date  . APPENDECTOMY    . HYSTERECTOMY  1992   partial   . PR ALLOGRAFT FOR SPINE SURGERY ONLY MORSELIZED Midline 08/28/2015   Procedure: Allograft For Spine Surgery Only; Morselized;  Surgeon: Toribio Jama Clap, MD;  Location: MAIN OR [facility];  Service: Orthopedics  . PR ANTERIOR INSTRUMENTATION 2-3 VERTEBRAL SEGMENTS Midline 08/28/2015   Procedure: Ant Instrum; 2 To 3 Verteb Segmt Cervical;  Surgeon: Toribio Jama Clap, MD;  Location: MAIN OR [facility];  Service: Orthopedics  . PR ARTHRODESIS ANT INTERBODY INC DISCECTOMY, CERVICAL BELOW C2 Midline 08/28/2015   Procedure: Arthrodes, Ant Intrbdy, Incl Disc Spc Prep, Discect, Osteophyt/Decompress Spinl Crd &/Or Nrv Rt, Crv Blo C2;  Surgeon: Toribio Jama Clap, MD;  Location: MAIN OR [facility];  Service: Orthopedics  . PR ARTHRODESIS ANT INTERBODY INC DISCECTOMY, CERVICAL BELOW C2 EACH ADDL Midline 08/28/2015   Procedure: Arthrod, Ant Intbdy, Incl Disc Spc Prep/Disctmy/Ostephyt/Decmpr Spnl Crd/Nrv Rt; Cerv Belo C2, Ea Add`L Spc;  Surgeon: Toribio Jama Clap, MD;  Location: MAIN OR [facility];  Service: Orthopedics  . PR AUTOGRAFT SPINE SURGERY LOCAL FROM SAME INCISION Midline 08/28/2015   Procedure: Autograft/Spine Surg Only (W/Harvest Graft); Local (Eg, Rib/Spinous Proc, Lam Frgmt) Obtain From Same Incis;  Surgeon: Toribio Jama Clap,  MD;  Location: MA[facility];  Service: Orthopedics  . PR COLONOSCOPY FLX DX W/COLLJ SPEC WHEN PFRMD Left 05/19/2015   Procedure: COLONOSCOPY, FLEXIBLE, PROXIMAL TO SPLENIC FLEXURE; DIAGNOSTIC, W/WO COLLECTION SPECIMEN BY BRUSH OR WASH;  Surgeon: Rodger Phlegm, MD;  Location: [facility] PROCEDURES [facility];  Service: Gastroenterology  . PR COLONOSCOPY FLX DX W/COLLJ SPEC WHEN PFRMD N/A 05/28/2021   Procedure: COLONOSCOPY, FLEXIBLE, PROXIMAL TO SPLENIC FLEXURE; DIAGNOSTIC, W/WO COLLECTION SPECIMEN BY BRUSH OR WASH;  Surgeon: Luke Cory Notch, MD;  Location: GI PROCEDURES [facility];  Service: Gastroenterology  . PR INSJ BIOMCHN DEV INTERVERTEBRAL DSC SPC W/ARTHRD N/A 08/28/2015   Procedure: Insert Interbody Biomechanical Device(S) With Integral Anterior Instrument For Device Anchoring, When Performed, To Intervertebral Disc Space In Conjunction With Interbody Arthrodesis, Each Interspace x2;  Surgeon: Toribio Jama Clap, MD;  Location: MAIN OR [facility];  Service: Orthopedics  . PR IONM 1 ON 1 IN OR W/ATTENDANCE EACH 15 MINUTES N/A 08/28/2015   Procedure: Continuous Intraoperative Neurophysiology Monitoring In Or;  Surgeon: Toribio Jama Clap, MD;  [facility];  Service: Orthopedics  . PR MICROSURG TECHNIQUES,REQ OPER MICROSCOPE  08/28/2015   Procedure: Microsurgical Techniques, Requiring Use Of Operating Microscope (List Separately In Addition To Code For Primary Procedure);  Surgeon: Toribio Jama Clap, MD;  Location: MAIN OR [facility];  Service: Orthopedics  . PR UPPER GI ENDOSCOPY,BIOPSY N/A 05/28/2021   Procedure: UGI ENDOSCOPY; WITH BIOPSY, SINGLE OR MULTIPLE;  Surgeon: Luke Cory Notch, MD;  Location: GI PROC[facility]URES [facility];  Service: Gastroenterology  . TUBAL LIGATION      Allergies Allergies  Allergen Reactions  . Latex Anaphylaxis  . Shellfish Containing Products Anaphylaxis    Medications amitriptyline, fexofenadine, indomethacin, metoclopramide,  (multivit,calc,mins/iron/folic), olmesartan, omeprazole , polyethylene glycol, tizanidine, ubrogepant, and verapamil  Physical Examination There were no vitals filed for this visit. There is no height or weight on file to calculate BMI.  Mental Status: AAOx3, thoughts organized   Lungs: Clear to auscultation, unlabored breathing   Heart: Regular rate and rhythm, normal S1 and S2, no murmur   Abdomen: Soft, non-tender, non-distended     ASSESSMENT AND PLAN Kelly Long has been evaluated and deemed appropriate to undergo the planned COLONOSCOPY, FLEXIBLE, PROXIMAL TO SPLENIC FLEXURE; DIAGNOSTIC, W/WO COLLECTION SPECIMEN BY BRUSH OR WASH.  The patient, or her authorized representative, was provided an explanation of the nature and benefits of the procedure(s), the most frequent risks, and alternatives, if any.  I personally reviewed this information with the patient, or her authorized representative, and answered all questions.

## 2022-12-08 NOTE — Anesthesia Postprocedure Evaluation (Signed)
 Patient: GISEL VIPOND  Scheduled: Procedure(s) with comments: COLONOSCOPY, FLEXIBLE, PROXIMAL TO SPLENIC FLEXURE; DIAGNOSTIC, W/WO COLLECTION SPECIMEN BY BRUSH OR WASH - GENERIC COLON SENT TO M/C 10-26-22,RX POOL, LOCAL PHARMACY,[id number] [place] [address] [place] [address] [[id number]] 7/16: R/S & MC  Anesthesia Start Date/Time: 12/08/22 0834   Procedure: COLONOSCOPY, FLEXIBLE, PROXIMAL TO SPLENIC FLEXURE;  DIAGNOSTIC, W/WO COLLECTION SPECIMEN BY BRUSH OR WASH (Left) - GENERIC COLON SENT TO M/C 10-26-22,RX POOL, LOCAL PHARMACY,[id number] [place] [address] [[id number]] 7/16: R/S & MC   Anesthesia type: TIVA   Pre-op diagnosis: Screening for colon cancer [Z12.11]   Location: GI [facility] OR [facility] / GI PROCEDURES [facility]   Providers: Cloria Lorrene Clarks, MD     Final Anesthesia Type: TIVA  Anesthesia Staff: Anesthesiologist: Cleatrice Anette Caldron, MD     No lines, drains, or airways are recorded for this episode.     Patient Location:   Last vitals:  Vitals Value Taken Time  BP 103/67 12/08/22 0905  Temp 36.2 C (97.2 F) 12/08/22 0852  Pulse 77 12/08/22 0906  Resp 17 12/08/22 0906  SpO2 97 % 12/08/22 0906  SpO2 Pulse 77 12/08/22 0906  Vitals shown include unfiled device data.    Level of consciousness: alert and awake Post vitals: stable PONV Present? no Pain score: 0 Pain management: adequate Airway patency: patent Cardiovascular status: stable and acceptable Respiratory status: acceptable, nonlabored ventilation and spontaneous ventilation Hydration status: acceptable    PACU PONV Meds: None  PACU Pain Meds: None  Anesthetic complications: There were no known notable events for this encounter.  _______________ Anette DELENA Cleatrice, MD 12/08/22

## 2023-08-25 NOTE — Nursing Note (Addendum)
 Patient complaint of 6/10 mid abdominal pain. Enos MD notified. Ordered to give patient PO fluid and up to restroom. Patient stated she passed gas in the restroom and discomfort is now 1/10. Discharge instructions reviewed with pt and driver; denies any questions.

## 2023-09-26 NOTE — Progress Notes (Signed)
 [facility]  Subjective:  History of Present Illness Kelly Long is a 58 year old female who presents with persistent hip and thigh pain following a nerve block injection.  She experiences a burning sensation in her hip and thigh, with tingling on the side of her thigh elicited by touch. The pain has persisted for a year and remains unchanged after a nerve block injection targeting the lateral femoral cutaneous nerve. Walking exacerbates the pain, described as a rubbing sensation against her hip, and is accompanied by numbness on the affected side. There is no back pain or radiation past the knee. She confirms groin pain and pulling sensations in the hip area during certain movements. She works in a business office, sitting and walking throughout the day.   has a past medical history of Arthritis, GERD (gastroesophageal reflux disease), Hypertension, Migraine, Migraine, Nipple dermatitis (05/04/2015), Rib pain (08/15/2013), Sleep apnea, and Snores.  Objective:  Physical Exam MUSCULOSKELETAL: Left hip exhibits near full range of motion with pain at the end of internal and external rotation. Mildly positive log roll and positive FADIR test. Tenderness over the anterior superior iliac spine and lateral femoral cutaneous nerve, reproducing burning and pain. Negative seated and supine straight leg raise. Lower extremity strength is grossly intact. Pain worsens with hip manipulation and is present with knee fall test.   Imaging: XR Hip Left w Pelvis 2 or 3 Views Result Date: 09/26/2023 EXAM: XR HIP LEFT W PELVIS 2 OR 3 VIEWS DATE: 09/26/2023 9:23 AM ACCESSION: [id number] UN DICTATED: 09/26/2023 10:02 AM INTERPRETATION LOCATION: [facility]   CLINICAL INDICATION: 58 years old Female with eval hip oa  - M25.552 - Hip pain, chronic, left - G89.29 - Hip pain, chronic, left   COMPARISON: Radiographs dated 08/10/2019   TECHNIQUE:  AP and lateral views of the left hip. AP view of the pelvis     FINDINGS: A bone fragment adjacent to the left iliac wing is unchanged and again suggests result of previous trauma or apophysitis. Joint width remains preserved at the hips, sacroiliac joints and symphysis. There is no new fracture or dislocation.     No acute osseous abnormality or evidence for inflammatory arthropathy. Bone fragment at the left iliac wing suggest the result of previous trauma or apophysitis.   Results Prior hip xrays 2021 mild b/l hip oa   RADIOLOGY Left hip x-ray: Bony ossification at the ASIS, larger and clearer compared to 2021, possible impingement of the lateral femoral cutaneous nerve (09/26/2023)  I personally reviewed images and agree with interpretation showing no fracture seen and mild b/l hip oa.  More apparent bone fragment adjacent to the left ASIS as compared to x-ray in 2021.   Assessment:  Diagnosis ICD-10-CM Associated Orders  1. Meralgia paresthetica, unspecified laterality  G57.10 MRI Lower Extremity Joint Left Wo Contrast    2. Hip pain, chronic, left  M25.552 XR Hip Left w Pelvis 2 or 3 Views   G89.29 MRI Lower Extremity Joint Left Wo Contrast      Plan: Assessment & Plan Meralgia Paresthetica Persistent lateral thigh pain and tingling despite prior injection. X-ray shows bony ossification at ASIS possibly impinging on lateral femoral cutaneous nerve. Differential includes nerve impingement or hip arthritis. Discussed unusual bony growth for her age and need for further evaluation. - Order MRI of hip to assess nerve impingement and bony growth at ASIS. - Consider diagnostic hip injection if MRI is inconclusive. - Consider nerve studies to confirm nerve impingement if needed.  Hip Arthritis Mild to moderate left hip arthritis with joint space narrowing. Pain with movement suggests possible symptom contribution. Hip joint injection discussed as lower priority diagnostic option. - Consider hip joint injection if MRI and other studies do  not clarify symptoms.  Patient has failed greater than 6 weeks of conservative therapy with anti-inflammatories, rest, activity modification, physical therapy, +/-injection and as such we are seeking authorization for MRI.   Return for MRI/CT results.   Eva Ruth, MD, CAQSM [facility] Sports [facility]  This note was created with use of [employer] AI documentation assistance. Please forgive any typographical errors.

## 2023-10-07 NOTE — Progress Notes (Signed)
 Encounter created to document clinic prep

## 2023-10-10 NOTE — Progress Notes (Signed)
 Myeloproliferative Neoplasm and Bone Marrow Failure Clinic Follow Up Visit  Patient: Kelly Long MRN: [id number] DOB: 1965/10/24 Date of Visit: 10/10/2023  Reason for Visit  Kelly Long is a 58 y.o. seen in follow up of persistent thrombocytosis.  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  She has received two intravenous iron infusions, but her hemoglobin has decreased to 9.9 from a previous level of 11.2. She denies heavy bleeding but has noticed a small amount of bright red blood in her stool. She experiences shortness of breath and dizziness, particularly when standing quickly or walking, which necessitates brief pauses. No palpitations or unusual heart rate. She mentions a sharp, non-crushing chest pain that was previously evaluated with no significant findings. She is currently taking omeprazole  and has not experienced any abdominal pain.  She missed a gastroenterology appointment in May due to confusion about the location and has not rescheduled. She has a history of migraines and has medication for them, though she has not taken it today.  Her past medical history includes a recent diagnosis of diabetes with an A1c of 6.5, indicating she was previously in the prediabetic range. She has started taking Mounjaro, which is the only new medication she has introduced recently.   Review of Systems  10-systems otherwise reviewed and negative except as per Interval History.  Assessment  #1 Thrombocytosis Platelet count normalized following two IV iron infusions. No issues with infusions.  #2 Iron deficiency anemia Hemoglobin decreased to 9.9 g/dL with normocytic, normochromic anemia. Occasional hematochezia, shortness of breath, and dizziness present. Reticulocyte count normal, ferritin slightly decreased post-infusion. Differential includes potential  bleeding or bone marrow issues. Awaiting further lab results.  She has not gotten an EGD done yet - planned to see GI in May, but there was confusion about the appointment and it was canceled.  Potential bone marrow biopsy discussed if anemia persists without obvious bleeding.     Plan  Labs in 4 weeks Follow up in clinic in 8 weeks Reschedule GI appt  Kelly Adjutant, RN, MSN, AGPCNP-C Nurse Practitioner Hematologic Malignancies [facility] 10/10/2023  I personally spent 35 minutes face-to-face and non-face-to-face in the care of this patient, which includes all pre, intra, and post visit time on the date of service. This time was spent in reviewing notes, labs and other test results in the chart, speaking with and examining the patient, communicating with other medical teams and documentation of the clinical encounter. All documented time was specific to the E/M visit and does not include any procedures that may have been performed.     History of Present Illness  Kelly Long has had isolated and persistent thrombocytosis (686,000/uL) since at least 2017.  She was seen by me for initial evaluation of isolated thrombocytosis on 07/23/2019 at which time it was noted that she had polyarticular arthralgias primarily of the hands, wrists, cervical spine and positive rheumatoid factor and +CCP antibodies but without active inflammatory arthritis.  She had peripheral blood testing at that time which was negative for JAK2 V617F, JAK2 exons 12-14, CALR Exon 9, MPL Exon 10 mutations.  Iron studies were low normal 02/09/2019: iron 68, TIBC 345, ferrin 52, % sat 20.    She is referred back today for persistent thrombocytosis, with platelet counts of 492,000/uL on 02/15/22 and 609,000/uL on 09/13/22.    CT abdomen and pelvis from  03/18/21:  no splenomegaly. No lymphadenopathy.  Medications   Current Outpatient Medications  Medication Sig Dispense Refill  . amitriptyline (ELAVIL) 50 MG tablet Take 2  tablets (100 mg total) by mouth nightly. 180 tablet 1  . fexofenadine (ALLEGRA) 180 MG tablet TAKE 1 TABLET BY MOUTH EVERY DAY 90 tablet 2  . indomethacin (INDOCIN SR) 75 mg CR capsule Take 1 capsule (75 mg total) by mouth in the morning and 1 capsule (75 mg total) in the evening. Take with meals. Do not crush or chew. Swallow capsule whole. 60 capsule 3  . metoclopramide (REGLAN) 10 MG tablet Take 1 tablet (10 mg total) by mouth daily as needed (migraine). 90 tablet 3  . multivit,calc,mins/iron/folic (WOMEN'S ONE DAILY ORAL) Take by mouth.    . olmesartan (BENICAR) 20 MG tablet TAKE 1 TABLET BY MOUTH EVERY DAY 90 tablet 3  . omeprazole  (PRILOSEC) 40 MG capsule Take 1 capsule (40 mg total) by mouth daily. 90 capsule 3  . rosuvastatin  (CRESTOR ) 5 MG tablet Take 1 tablet (5 mg total) by mouth daily. For cholesterol 90 tablet 3  . tirzepatide (MOUNJARO) 2.5 mg/0.5 mL PnIj Inject 0.5 mL (2.5 mg total) under the skin every seven (7) days. 2 mL 3  . tizanidine (ZANAFLEX) 4 MG tablet TAKE 2 TABLETS BY MOUTH NIGHTLY. 180 tablet 3  . UBRELVY 50 mg tablet 1-2TABS BY MOUTH FOR MIGRAINE/HEADACHE*IF SYMPTOMS PERSIST/RETURN MAY REPEAT DOSE AFTER 2HRS*MAX:100MG /24HRS 16 tablet 6  . verapamil (CALAN-SR) 120 MG CR tablet TAKE 1 TABLET BY MOUTH TWICE A DAY 180 tablet 3   No current facility-administered medications for this visit.     Allergies   Allergies  Allergen Reactions  . Latex Anaphylaxis  . Shellfish Containing Products Anaphylaxis    Past Medical and Surgical History   Past Medical History:  Diagnosis Date  . Arthritis    right knee left shoulder and lower back  . GERD (gastroesophageal reflux disease)   . Hypertension   . Migraine   . Migraine   . Nipple dermatitis 05/04/2015  . Rib pain 08/15/2013  . Sleep apnea   . Snores    Past Surgical History:  Procedure Laterality Date  . APPENDECTOMY    . HYSTERECTOMY  1992   partial   . PR ALLOGRAFT FOR SPINE SURGERY ONLY MORSELIZED  Midline 08/28/2015   Procedure: Allograft For Spine Surgery Only; Morselized;  Surgeon: Toribio Jama Clap, MD;  Location: MAIN OR [facility];  Service: Orthopedics  . PR ANTERIOR INSTRUMENTATION 2-3 VERTEBRAL SEGMENTS Midline 08/28/2015   Procedure: Ant Instrum; 2 To 3 Verteb Segmt Cervical;  Surgeon: Toribio Jama Clap, MD;  Location: MAIN OR [facility];  Service: Orthopedics  . PR ARTHRODESIS ANT INTERBODY INC DISCECTOMY, CERVICAL BELOW C2 Midline 08/28/2015   Procedure: Arthrodes, Ant Intrbdy, Incl Disc Spc Prep, Discect, Osteophyt/Decompress Spinl Crd &/Or Nrv Rt, Crv Blo C2;  Surgeon: Toribio Jama Clap, MD;  Location: MAIN OR [facility];  Service: Orthopedics  . PR ARTHRODESIS ANT INTERBODY INC DISCECTOMY, CERVICAL BELOW C2 EACH ADDL Midline 08/28/2015   Procedure: Arthrod, Ant Intbdy, Incl Disc Spc Prep/Disctmy/Ostephyt/Decmpr Spnl Crd/Nrv Rt; Cerv Belo C2, Ea Add`L Spc;  Surgeon: Toribio Jama Clap, MD;  Location: MAIN OR [facility];  Service: Orthopedics  . PR AUTOGRAFT SPINE SURGERY LOCAL FROM SAME INCISION Midline 08/28/2015   Procedure: Autograft/Spine Surg Only (W/Harvest Graft); Local (Eg, Rib/Spinous Proc, Lam Frgmt) Obtain From Same Incis;  Surgeon: Toribio Jama Clap, MD;  Location: MAIN OR [facility];  Service: Orthopedics  .  PR COLONOSCOPY FLX DX W/COLLJ SPEC WHEN PFRMD Left 05/19/2015   Procedure: COLONOSCOPY, FLEXIBLE, PROXIMAL TO SPLENIC FLEXURE; DIAGNOSTIC, W/WO COLLECTION SPECIMEN BY BRUSH OR WASH;  Surgeon: Rodger Phlegm, MD;  Location: GI PROCEDURES [facility] [facility];  Service: Gastroenterology  . PR COLONOSCOPY FLX DX W/COLLJ SPEC WHEN PFRMD N/A 05/28/2021   Procedure: COLONOSCOPY, FLEXIBLE, PROXIMAL TO SPLENIC FLEXURE; DIAGNOSTIC, W/WO COLLECTION SPECIMEN BY BRUSH OR WASH;  Surgeon: Luke Cory Notch, MD;  [facility];  Service: Gastroenterology  . PR COLONOSCOPY FLX DX W/COLLJ SPEC WHEN PFRMD Left 12/08/2022   Procedure: COLONOSCOPY, FLEXIBLE, PROXIMAL TO SPLENIC FLEXURE;  DIAGNOSTIC, W/WO COLLECTION SPECIMEN BY BRUSH OR WASH;  Surgeon: Cloria Lorrene Clarks, MD;  Location: GI PROCEDURES [facility];  Service: Gastroenterology  . PR COLONOSCOPY FLX DX W/COLLJ SPEC WHEN PFRMD Left 08/25/2023   Procedure: COLONOSCOPY, FLEXIBLE, PROXIMAL TO SPLENIC FLEXURE; DIAGNOSTIC, W/WO COLLECTION SPECIMEN BY BRUSH OR WASH;  Surgeon: Enos Olam Jansky, MD;  Location: GI PROCEDURES [place] [facility];  Service: Gastroenterology  . PR INSJ BIOMCHN DEV INTERVERTEBRAL DSC SPC W/ARTHRD N/A 08/28/2015   Procedure: Insert Interbody Biomechanical Device(S) With Integral Anterior Instrument For Device Anchoring, When Performed, To Intervertebral Disc Space In Conjunction With Interbody Arthrodesis, Each Interspace x2;  Surgeon: Toribio Jama Clap, MD;  Location: MAIN OR [facility];  Service: Orthopedics  . PR IONM 1 ON 1 IN OR W/ATTENDANCE EACH 15 MINUTES N/A 08/28/2015   Procedure: Continuous Intraoperative Neurophysiology Monitoring In Or;  Surgeon: Toribio Jama Clap, MD;  Location: MAIN OR [facility];  Service: Orthopedics  . PR MICROSURG TECHNIQUES,REQ OPER MICROSCOPE  08/28/2015   Procedure: Microsurgical Techniques, Requiring Use Of Operating Microscope (List Separately In Addition To Code For Primary Procedure);  Surgeon: Toribio Jama Clap, MD;  Location: MAIN OR [facility];  Service: Orthopedics  . PR UPPER GI ENDOSCOPY,BIOPSY N/A 05/28/2021   Procedure: UGI ENDOSCOPY; WITH BIOPSY, SINGLE OR MULTIPLE;  Surgeon: Luke Cory Notch, MD;  [facility];  Service: Gastroenterology  . TUBAL LIGATION      Social History   Social History   Socioeconomic History  . Marital status: Married    Spouse name: Kierston Plasencia  Occupational History  . Occupation: Haematologist   Tobacco Use  . Smoking status: Never    Passive exposure: Never  . Smokeless tobacco: Never  Vaping Use  . Vaping status: Never Used  Substance and Sexual Activity  . Alcohol use: No  .  Drug use: No  Other Topics Concern  . Exercise No  . Living Situation No  Social History Narrative   Works at [employer] for OR, married with 3 kids, multple grandkids, husband is a education officer, environmental.    Social Drivers of Health   Financial Resource Strain: Low Risk  (04/27/2022)   Overall Financial Resource Strain (CARDIA)   . Difficulty of Paying Living Expenses: Not very hard  Food Insecurity: No Food Insecurity (08/02/2023)   Hunger Vital Sign   . Worried About Programme Researcher, Broadcasting/film/video in the Last Year: Never true   . Ran Out of Food in the Last Year: Never true  Transportation Needs: No Transportation Needs (08/02/2023)   PRAPARE - Transportation   . Lack of Transportation (Medical): No   . Lack of Transportation (Non-Medical): No  Housing: Low Risk  (08/02/2023)   Housing   . Within the past 12 months, have you ever stayed: outside, in a car, in a tent, in an overnight shelter, or temporarily in someone else's  home (i.e. couch-surfing)?: No   . 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  Physical Examination   Vitals:   10/10/23 0931  BP: 102/66  Pulse: 78  Resp: 17  Temp: 36.1 C (97 F)  TempSrc: Temporal  SpO2: 100%  Weight: (!) 107 kg (235 lb 14.3 oz)   GENERAL:  The patient appears well and is in no acute distress HEENT: No scleral icterus. RESP: Breathing comfortably and unlabored speech.   NEURO: A&Ox4, CN observed are WNL PSYCH: Normal affect, affect is full and congruent.  No psychomotor agitation nor retardation.  Thought process is goal-directed and linear.    Laboratory Testing and  Imaging   Results for orders placed or performed in visit on 10/10/23  CBC w/ Differential  Result Value Ref Range   WBC 7.1 3.6 - 11.2 10*9/L   RBC 3.49 (L) 3.95 - 5.13 10*12/L   HGB 9.9 (L) 11.3 - 14.9 g/dL   HCT 69.7 (L) 65.9 - 55.9 %   MCV 86.6 77.6 - 95.7 fL   MCH 28.4 25.9 - 32.4 pg   MCHC 32.8 32.0 - 36.0 g/dL   RDW 83.8 (H) 87.7 - 84.7 %   MPV 7.6 6.8 - 10.7 fL   Platelet 441 150 - 450 10*9/L   Neutrophils % 56.9 %   Lymphocytes % 29.8 %   Monocytes % 6.6 %   Eosinophils % 5.5 %   Basophils % 1.2 %   Absolute Neutrophils 4.0 1.8 - 7.8 10*9/L   Absolute Lymphocytes 2.1 1.1 - 3.6 10*9/L   Absolute Monocytes 0.5 0.3 - 0.8 10*9/L   Absolute Eosinophils 0.4 0.0 - 0.5 10*9/L   Absolute Basophils 0.1 0.0 - 0.1 10*9/L   Anisocytosis Slight (A) Not Present

## 2023-12-05 NOTE — Progress Notes (Signed)
 [facility] Visit  Assessment and Plan:   Assessment & Plan Type 2 diabetes mellitus Blood sugars progressed to type 2 diabetes range. Mounjaro initially caused nausea, now managed with Zofran , takes about once weekly. Discussed hereditary factors, lifestyle influences, and high blood sugar impact on organs. Emphasized regular monitoring and lifestyle modifications. - Increase Mounjaro dose to 5 mg weekly - continue zofran  prn N/V - check A1c now - UACR today - f/u 3 months for recheck, sooner if any bad side effects from increased dose of mounjaro  Obstructive sleep apnea Reports difficulty staying asleep, frequent awakenings, and snoring. CPAP often removed during sleep. No prior consultation with a sleep specialist beyond initial sleep study. Discussed potential impact on memory and overall health. - Consider referral to a sleep specialist for further evaluation and management. - continue nightly cpap  Hyperlipidemia Managed with rosuvastatin . Discussed importance of cholesterol management in preventing arterial plaque buildup, especially in context of diabetes. - Continue rosuvastatin  therapy.  Assessment & Plan Type 2 diabetes mellitus without complication, without long-term current use of insulin        Orders: .  Albumin/creatinine urine ratio .  Hemoglobin A1c .  tirzepatide (MOUNJARO) 5 mg/0.5 mL PnIj pen; Inject 0.5 mL (5 mg total) under the skin every seven (7) days.      Subjective:   Chief Complaint  Patient presents with  . Follow-up    Diabetes  Nonfasting  Concerns: still taking monjauro and the nausea medication had helped (dose change due to the weight?)    History of Present Illness Kelly Long is a 58 year old female with type 2 diabetes and sleep apnea who presents with sleep disturbances and weight loss concerns.  She experiences ongoing sleep disturbances, primarily characterized by difficulty staying asleep. She  typically sleeps for only one to two hours at a time, with the longest stretch being four hours. She often wakes up and reads to help herself fall back asleep. Snoring is present, and she has been previously diagnosed with sleep apnea. Her husband has observed her sitting up or standing during sleep, particularly after long days. She uses a CPAP machine but often removes it during the night.  She has been experiencing nausea, which has improved since starting Mounjaro injections for her type 2 diabetes. Initially, the injections caused significant nausea, but this has become more tolerable over time. She uses Zofran  as needed, typically around the time of her injections. Despite reduced appetite and feeling full after drinking water, she is concerned about not losing weight as expected. She is currently on the lowest dose of Mounjaro and has not experienced vomiting since starting Zofran .  Her blood sugars were previously noted to be in the range of prediabetes and then type 2 diabetes, as discussed with her doctor. She maintains a healthy diet, avoiding red meat and focusing on chicken, fish, and salads. She engages in regular physical activity, walking her dog twice daily.  She has a history of migraines and sleep apnea, which are relevant to her current health concerns. She has been using rosuvastatin  for cholesterol management. She is due for an eye exam, which was previously rescheduled due to vomiting.  I have reviewed the patient's problem list, current medications, allergies, medical history, family history, and social history and updated them as needed.  Objective:   Vitals:   12/05/23 1541  BP: 122/63  Pulse: 95  Temp: 36.7 C (98 F)   BP Readings from Last 3  Encounters:  12/05/23 122/63  10/10/23 102/66  08/25/23 99/68   Wt Readings from Last 3 Encounters:  12/05/23 (!) 106.1 kg (234 lb)  10/10/23 (!) 107 kg (235 lb 14.3 oz)  08/25/23 (!) 102.5 kg (226 lb)   PHQ-2 Score:    PHQ-9 Score:   GAD-7 Score:     Physical exam: Physical Exam   Results: Results LABS Blood glucose: Consistent with Type 2 diabetes  DIAGNOSTIC Sleep study: Sleep apnea detected

## 2024-01-11 NOTE — Telephone Encounter (Signed)
-------------------------------------------------------------------------------   Summary: 3rd RS attempt, LVM, sent MyChart -------------------------------------------------------------------------------  Patient Kelly Long was called in attempt number 3 to reach them regarding rescheduling their appointments that were missed on 12/05/23.   The call resulted in: Message left and MyChart message sent   Please reschedule the patient upon their return call on next available for the following:   Lab Appointment and Return Visit w/ Asberry Adjutant  Thank you,  Ronnald Muta

## 2024-01-19 NOTE — Progress Notes (Signed)
 [facility]  Subjective:  History of Present Illness Kelly Long is a 58 year old female who presents with worsening burning and numbness in the hip and thigh.  Over the past four to five months, she experiences worsening burning and numbness in her hip and thigh. These symptoms disrupt her sleep when lying on her preferred side. The discomfort does not radiate past the knee, but tingling occurs when she flexes her neck. Previous treatments, including a nerve injection, have not provided relief. An MRI of the hip showed no significant findings. She underwent spinal surgery with screws and a plate at R4-3 and C6-7 eight years ago.   has a past medical history of Arthritis, GERD (gastroesophageal reflux disease), Hypertension, Migraine, Migraine, Nipple dermatitis (05/04/2015), Rib pain (08/15/2013), Sleep apnea, and Snores.  Objective:  Physical Exam MUSCULOSKELETAL: Left hip exhibits full range of motion with mild tenderness over the anterior superior iliac spine. Lower extremity strength is grossly intact. Increased symptoms are present on seated and supine straight leg raise and slump test.   Imaging: Mammo Screening Bilateral Tomo Result Date: 01/19/2024 EXAM: MAMMO SCREENING BILATERAL TOMO ACCESSION: [id number] UN   CLINICAL INDICATION: 58 years old Female: SCREENING MAMMOGRAM  - Z12.31 - Encounter for screening mammogram for malignant neoplasm of breast    COMPARISON: Prior exam(s) were available and reviewed for comparison   TECHNIQUE: Bilateral breast full field digital or synthesized mammography MLO and CC views with tomosynthesis. Tomosynthesis imaging was used to improve the sensitivity and specificity of the detection of breast cancer.   BREAST DENSITY: a - The breasts are almost entirely fatty.   FINDINGS: No mammographic findings of malignancy in either breast. No significant interval change.       ASSESSMENT: BI-RADS Category: 1-Mammo1Yr : Negative.   Mammogram due in 1 year.   Recommendation Laterality: Both   Annual routine screening mammogram is recommended.         XR Lumbar Spine 2 or 3 Views Result Date: 01/19/2024 EXAM: XR LUMBAR SPINE 2 OR 3 VIEWS DATE: 01/19/2024 9:54 AM ACCESSION: [id number] UN DICTATED: 01/19/2024 10:15 AM INTERPRETATION LOCATION: [facility]   CLINICAL INDICATION: 58 years old Female with eval lumbar ddd  - M54.10 - Radicular syndrome of left leg    COMPARISON: None.   TECHNIQUE: AP and lateral views of the lumbar spine.   FINDINGS: No acute fracture. No listhesis. Mild intervertebral narrowing with tiny endplate osteophytes, greatest at L5-S1. Lower lumbar spine facet osseous overgrowth. SI joints are approximated. No dilated loop of bowel.     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   I personally reviewed images and agree with interpretation showing lower lumbar ddd  MSK US : N/A  Assessment:  Diagnosis ICD-10-CM Associated Orders  1. Radicular syndrome of left leg  M54.10 XR Lumbar Spine 2 or 3 Views    MRI lumbar spine without contrast    2. 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 MRI of the hip was unremarkable. Previous nerve block injection did not provide relief. Differential diagnosis includes lumbar radiculopathy. - Order lumbar spine x-ray for insurance approval for MRI. - Order MRI of the lumbar spine to evaluate for potential radiculopathy, particularly in the L4/L5 region.  Suspected lumbar radiculopathy (L4/L5 region) Suspected lumbar radiculopathy in the L4/L5 region based  on physical examination findings. Symptoms include burning pain in the thigh, possibly referred from the lumbar spine. Previous cervical spine surgery increases  the likelihood of concurrent lumbar spine issues. - Order lumbar spine x-ray for insurance approval for MRI. - Order MRI of the lumbar spine to evaluate for potential radiculopathy, particularly in the L4/L5 region.  Patient has failed greater than 6 weeks of conservative therapy with anti-inflammatories, rest, activity modification, physical therapy, +/-injection and as such we are seeking authorization for MRI.   Return for MRI/CT results.   Eva Ruth, MD, CAQSM Primary Care Sports Medicine [facility]  This note was created with use of Abridge AI documentation assistance. Please forgive any typographical errors.

## 2024-02-28 NOTE — Progress Notes (Signed)
 [facility] - [facility] Visit  Kelly Long. Wann is a 58 y.o. female being seen today for: Assessment & Plan Type 2 diabetes mellitus without complication, without long-term current use of insulin (CMS-HCC) A1c today 6.1. Urine albumin/creatinine ratio was 3.1 at last visit in August. She continues walking 3x daily. Denies any new side effects with Mounjaro, takes Zofran  for nausea related to Mounjaro no more than once per week. Weight 243 lbs today (239 in August). Bowel movements have been somewhat irregular but soft, 2-3 times weekly.  - Increase Mounjaro to 7.5 mg - Continue Miralax  1 cap full daily  - Monitor any change in side effects on increased dose of Mounjaro. Follow up as needed.  Orders: .  POCT Glycated Hemoglobin (HGB A1c) .  tirzepatide (MOUNJARO) 7.5 mg/0.5 mL PnIj; Inject 7.5 mg under the skin every seven (7) days.  Essential hypertension BP average today 146/91 in clinic. She reports that most recent systolic taken at home was in 859d. Does not take BP measurements at home regularly. No acute stressors at home.  - Increase olmesartan to 40 mg daily  - Record 2x BP measurements at home per week  - Follow up in 2 months to monitor BP and home measurements on new dose Orders: .  olmesartan (BENICAR) 40 MG tablet; Take 1 tablet (40 mg total) by mouth daily.  Return in about 2 months (around 04/29/2024).  HEALTH MAINTENANCE ITEMS STILL DUE: Health Maintenance Due  Topic Date Due  . Retinal Eye Exam  Never done  . COVID-19 Vaccine (6 - 2025-26 season) 12/12/2023    Subjective:  HPI: Kelly Long. Kelly Long is a 58 y.o. female being seen for:  Chief Complaint  Patient presents with  . Follow-up    Diabetes  L big toe: having burning sensation in between toes Neck area where screws are: also having burning sensation L buttock: burning sensation  Nonfasting      #Diabetes A1c 6.1 today Denies side effects from Mounjaro. Periodic nausea, but requires Zofran   at most once weekly  Bowel movements have been less regular, 2-3 per week. She's tried Miralax  OTC but does not take it regularly.   Not losing weight on Mounjaro despite walking 3x daily   #Burning sensation  Pain down L left leg and to big toe. Recently completed MRI.  Follow up scheduled on Thursday 11/20 w/ Dr. Jama   #Blood pressure  Systolic BP in 140s last time she checked it at home. Does not check at home regularly right now Taking BP medication as prescribed daily  Denies acute stress/dietary changes.    I have reviewed the patient's problem list, current medications, allergies, medical history, family history, and social history and updated them as needed.  Objective:   Vitals:   02/28/24 1512  BP: 146/91  Pulse: 98  Temp:    BP Readings from Last 3 Encounters:  02/28/24 146/91  12/05/23 122/63  10/10/23 102/66   Wt Readings from Last 3 Encounters:  02/28/24 (!) 110.2 kg (243 lb)  01/19/24 (!) 108.4 kg (239 lb)  12/05/23 (!) 106.1 kg (234 lb)   PHQ-2 Score: PHQ-2 Total Score : 0  Physical exam: GENERAL: Well appearing, in no acute distress HEENT: normocephalic/ atraumatic, EOMI, MMM PULM: normal WOB on room air MSK: normal gait NEURO: No focal neurologic deficits, face symmetric, no movement disorder noted PSYCH: full affect, normal fluent speech   [employer] [facility] [employer]  Chart created by a medical  student, reviewed, amended and signed by Michal Charon, MD   Student attestation: This document serves as a record of the services and decisions personally performed and made by a medical student who interviewed the patient and documented this note under the supervision of Michal Charon, M.D.     I attest that I have reviewed the student note and that the components of the history of the present illness, the physical exam, and the assessment and plan documented were performed by me or were performed in my presence by the student and verified by  me. The time documented was spent by me.

## 2024-03-01 NOTE — Progress Notes (Signed)
 [facility]  Subjective:  History of Present Illness Kelly Long is a 58 year old female who presents with persistent hip and toe pain.  She experiences persistent hip pain with a burning sensation near her big toe occurring every couple of days. The burning extends from her hip to her big toe, skipping the shin, and is accompanied by tingling. Previous hip x-ray and MRI, as well as an injection around the hip nerve, did not alleviate her symptoms. Physical therapy attempted with her sister worsened her symptoms. She underwent a three-level spinal fusion 8-10 years ago, which was prompted by impaired walking. She has not had back injections in the past. Tingling and burning sensations are noted, particularly with certain movements.   has a past medical history of Arthritis, GERD (gastroesophageal reflux disease), Hypertension, Migraine, Migraine, Nipple dermatitis (05/04/2015), Rib pain (08/15/2013), Sleep apnea, and Snores.  Objective:  Physical Exam MUSCULOSKELETAL: Left hip full range of motion without issue. NEUROLOGICAL: Grossly positive seated straight leg and slump test reproduces burning and paresthesias into the lateral and anterior thigh.   Imaging: MRI lumbar spine without contrast Result Date: 02/04/2024  Impression: Overall mild degenerative changes of the lumbar spine, greatest at L4-L5 with mild posterior disc bulge and moderate facet arthropathy. No significant spinal canal or foraminal narrowing at any level.   Mammo Screening Bilateral Tomo Result Date: 01/19/2024  Impression: ASSESSMENT: BI-RADS Category: 1-Mammo1Yr : Negative.  Mammogram due in 1 year. Recommendation Laterality: Both Annual routine screening mammogram is recommended.   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   I personally reviewed images and agree with interpretation showing L L4-L5  foraminal narrowing, though no true nerve impingement  MSK US : N/A  Assessment:  Diagnosis ICD-10-CM Associated Orders  1. Radicular syndrome of left leg  M54.10 Ambulatory referral to Spine Center    2. Chronic left-sided lumbar radiculopathy  M54.16 Ambulatory referral to Spine Center      Plan: Assessment & Plan Chronic left-sided lumbar radiculopathy Intermittent burning near the big toe, tingling, and neck pain likely related to previous spinal surgery. MRI shows L4-L5 narrowing, suggesting nerve irritation. Symptoms indicate lumbar radiculopathy, not hip pathology. Differential includes L4-L5 nerve compression. - Referred to spine center for evaluation and potential back injection (lumbar esi) - Consider nerve conduction study if symptoms persist.  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 MRI suggests lumbar radiculopathy as primary issue, not hip pathology.   Return if symptoms worsen or fail to improve.   Eva Ruth, MD, CAQSM Primary Care Sports Medicine [facility]  This note was created with use of Abridge AI documentation assistance. Please forgive any typographical errors.

## 2024-03-20 ENCOUNTER — Emergency Department

## 2024-03-20 ENCOUNTER — Other Ambulatory Visit: Payer: Self-pay

## 2024-03-20 ENCOUNTER — Inpatient Hospital Stay
Admission: EM | Admit: 2024-03-20 | Discharge: 2024-03-22 | DRG: 640 | Disposition: A | Discharge status: Home / Self Care | Attending: Internal Medicine | Admitting: Internal Medicine

## 2024-03-20 ENCOUNTER — Inpatient Hospital Stay: Admit: 2024-03-20 | Discharge: 2024-03-20 | Disposition: A | Attending: Pulmonary Disease

## 2024-03-20 ENCOUNTER — Inpatient Hospital Stay

## 2024-03-20 DIAGNOSIS — N1831 Chronic kidney disease, stage 3a: Secondary | ICD-10-CM | POA: Diagnosis present

## 2024-03-20 DIAGNOSIS — Z6841 Body Mass Index (BMI) 40.0 and over, adult: Secondary | ICD-10-CM | POA: Diagnosis not present

## 2024-03-20 DIAGNOSIS — R571 Hypovolemic shock: Secondary | ICD-10-CM

## 2024-03-20 DIAGNOSIS — R579 Shock, unspecified: Secondary | ICD-10-CM | POA: Diagnosis not present

## 2024-03-20 DIAGNOSIS — M5416 Radiculopathy, lumbar region: Secondary | ICD-10-CM | POA: Diagnosis present

## 2024-03-20 DIAGNOSIS — G43909 Migraine, unspecified, not intractable, without status migrainosus: Secondary | ICD-10-CM | POA: Diagnosis present

## 2024-03-20 DIAGNOSIS — E860 Dehydration: Secondary | ICD-10-CM | POA: Diagnosis present

## 2024-03-20 DIAGNOSIS — M1990 Unspecified osteoarthritis, unspecified site: Secondary | ICD-10-CM | POA: Diagnosis present

## 2024-03-20 DIAGNOSIS — R55 Syncope and collapse: Secondary | ICD-10-CM

## 2024-03-20 DIAGNOSIS — D75838 Other thrombocytosis: Secondary | ICD-10-CM | POA: Diagnosis present

## 2024-03-20 DIAGNOSIS — Z888 Allergy status to other drugs, medicaments and biological substances status: Secondary | ICD-10-CM | POA: Diagnosis not present

## 2024-03-20 DIAGNOSIS — E861 Hypovolemia: Secondary | ICD-10-CM | POA: Diagnosis present

## 2024-03-20 DIAGNOSIS — T465X5A Adverse effect of other antihypertensive drugs, initial encounter: Secondary | ICD-10-CM | POA: Diagnosis present

## 2024-03-20 DIAGNOSIS — N179 Acute kidney failure, unspecified: Secondary | ICD-10-CM | POA: Diagnosis present

## 2024-03-20 DIAGNOSIS — Z7985 Long-term (current) use of injectable non-insulin antidiabetic drugs: Secondary | ICD-10-CM | POA: Diagnosis not present

## 2024-03-20 DIAGNOSIS — Z79899 Other long term (current) drug therapy: Secondary | ICD-10-CM | POA: Diagnosis not present

## 2024-03-20 DIAGNOSIS — Z91040 Latex allergy status: Secondary | ICD-10-CM | POA: Diagnosis not present

## 2024-03-20 DIAGNOSIS — E872 Acidosis, unspecified: Secondary | ICD-10-CM | POA: Diagnosis present

## 2024-03-20 DIAGNOSIS — I129 Hypertensive chronic kidney disease with stage 1 through stage 4 chronic kidney disease, or unspecified chronic kidney disease: Secondary | ICD-10-CM | POA: Diagnosis present

## 2024-03-20 DIAGNOSIS — E66813 Obesity, class 3: Secondary | ICD-10-CM | POA: Diagnosis present

## 2024-03-20 DIAGNOSIS — D649 Anemia, unspecified: Secondary | ICD-10-CM | POA: Diagnosis present

## 2024-03-20 DIAGNOSIS — E1122 Type 2 diabetes mellitus with diabetic chronic kidney disease: Secondary | ICD-10-CM | POA: Diagnosis present

## 2024-03-20 DIAGNOSIS — R197 Diarrhea, unspecified: Secondary | ICD-10-CM | POA: Diagnosis present

## 2024-03-20 HISTORY — DX: Type 2 diabetes mellitus without complications: E11.9

## 2024-03-20 HISTORY — DX: Essential (primary) hypertension: I10

## 2024-03-20 HISTORY — DX: Radiculopathy, lumbar region: M54.16

## 2024-03-20 LAB — BASIC METABOLIC PANEL WITH GFR
Anion gap: 17 — ABNORMAL HIGH (ref 5–15)
Anion gap: 12 (ref 5–15)
BUN: 45 mg/dL — ABNORMAL HIGH (ref 6–20)
BUN: 29 mg/dL — ABNORMAL HIGH (ref 6–20)
CO2: 18 mmol/L — ABNORMAL LOW (ref 22–32)
CO2: 23 mmol/L (ref 22–32)
Calcium: 8.8 mg/dL — ABNORMAL LOW (ref 8.9–10.3)
Calcium: 9.3 mg/dL (ref 8.9–10.3)
Chloride: 104 mmol/L (ref 98–111)
Chloride: 103 mmol/L (ref 98–111)
Creatinine, Ser: 3.74 mg/dL — ABNORMAL HIGH (ref 0.44–1.00)
Creatinine, Ser: 1.99 mg/dL — ABNORMAL HIGH (ref 0.44–1.00)
GFR, Estimated: 13 mL/min — ABNORMAL LOW (ref >60)
GFR, Estimated: 28 mL/min — ABNORMAL LOW (ref >60)
Glucose, Bld: 82 mg/dL (ref 70–99)
Glucose, Bld: 90 mg/dL (ref 70–99)
Potassium: 5.1 mmol/L (ref 3.5–5.1)
Potassium: 4.9 mmol/L (ref 3.5–5.1)
Sodium: 138 mmol/L (ref 135–145)
Sodium: 138 mmol/L (ref 135–145)

## 2024-03-20 LAB — CBC
HCT: 38.2 % (ref 36.0–46.0)
Hemoglobin: 12.4 g/dL (ref 12.0–15.0)
MCH: 28.2 pg (ref 26.0–34.0)
MCHC: 32.5 g/dL (ref 30.0–36.0)
MCV: 87 fL (ref 80.0–100.0)
Platelets: 449 K/uL — ABNORMAL HIGH (ref 150–400)
RBC: 4.39 MIL/uL (ref 3.87–5.11)
RDW: 14.2 % (ref 11.5–15.5)
WBC: 9.8 K/uL (ref 4.0–10.5)
nRBC: 0 % (ref 0.0–0.2)

## 2024-03-20 LAB — URINALYSIS, W/ REFLEX TO CULTURE (INFECTION SUSPECTED)
Bilirubin Urine: NEGATIVE
Glucose, UA: NEGATIVE mg/dL
Hgb urine dipstick: NEGATIVE
Ketones, ur: NEGATIVE mg/dL
Leukocytes,Ua: NEGATIVE
Nitrite: NEGATIVE
Protein, ur: NEGATIVE mg/dL
Specific Gravity, Urine: 1.005 (ref 1.005–1.030)
pH: 5 (ref 5.0–8.0)

## 2024-03-20 LAB — CBC WITH DIFFERENTIAL/PLATELET
Abs Immature Granulocytes: 0.03 K/uL (ref 0.00–0.07)
Basophils Absolute: 0 K/uL (ref 0.0–0.1)
Basophils Relative: 0 %
Eosinophils Absolute: 0.5 K/uL (ref 0.0–0.5)
Eosinophils Relative: 6 %
HCT: 30.2 % — ABNORMAL LOW (ref 36.0–46.0)
Hemoglobin: 9.8 g/dL — ABNORMAL LOW (ref 12.0–15.0)
Immature Granulocytes: 0 %
Lymphocytes Relative: 28 %
Lymphs Abs: 2.5 K/uL (ref 0.7–4.0)
MCH: 28.5 pg (ref 26.0–34.0)
MCHC: 32.5 g/dL (ref 30.0–36.0)
MCV: 87.8 fL (ref 80.0–100.0)
Monocytes Absolute: 0.6 K/uL (ref 0.1–1.0)
Monocytes Relative: 7 %
Neutro Abs: 5.1 K/uL (ref 1.7–7.7)
Neutrophils Relative %: 59 %
Platelets: 462 K/uL — ABNORMAL HIGH (ref 150–400)
RBC: 3.44 MIL/uL — ABNORMAL LOW (ref 3.87–5.11)
RDW: 14.3 % (ref 11.5–15.5)
WBC: 8.8 K/uL (ref 4.0–10.5)
nRBC: 0 % (ref 0.0–0.2)

## 2024-03-20 LAB — PHOSPHORUS: Phosphorus: 3.1 mg/dL (ref 2.5–4.6)

## 2024-03-20 LAB — CK: Total CK: 77 U/L (ref 38–234)

## 2024-03-20 LAB — ECHOCARDIOGRAM COMPLETE
AR max vel: 1.6 cm2
AV Area VTI: 1.68 cm2
AV Area mean vel: 1.55 cm2
AV Mean grad: 6 mmHg
AV Peak grad: 12.3 mmHg
Ao pk vel: 1.75 m/s
Area-P 1/2: 4.15 cm2
Calc EF: 73.3 %
Height: 64 in
MV VTI: 2.26 cm2
S' Lateral: 3.7 cm
Single Plane A2C EF: 61.1 %
Single Plane A4C EF: 80.2 %
Weight: 3918.9 [oz_av]

## 2024-03-20 LAB — COMPREHENSIVE METABOLIC PANEL WITH GFR
ALT: 12 U/L (ref 0–44)
AST: 18 U/L (ref 15–41)
Albumin: 3.6 g/dL (ref 3.5–5.0)
Alkaline Phosphatase: 83 U/L (ref 38–126)
Anion gap: 12 (ref 5–15)
BUN: 49 mg/dL — ABNORMAL HIGH (ref 6–20)
CO2: 21 mmol/L — ABNORMAL LOW (ref 22–32)
Calcium: 8.7 mg/dL — ABNORMAL LOW (ref 8.9–10.3)
Chloride: 104 mmol/L (ref 98–111)
Creatinine, Ser: 4.61 mg/dL — ABNORMAL HIGH (ref 0.44–1.00)
GFR, Estimated: 10 mL/min — ABNORMAL LOW (ref >60)
Glucose, Bld: 102 mg/dL — ABNORMAL HIGH (ref 70–99)
Potassium: 4.3 mmol/L (ref 3.5–5.1)
Sodium: 138 mmol/L (ref 135–145)
Total Bilirubin: <0.2 mg/dL (ref 0.0–1.2)
Total Protein: 6.4 g/dL — ABNORMAL LOW (ref 6.5–8.1)

## 2024-03-20 LAB — PROTIME-INR
INR: 1.1 (ref 0.8–1.2)
Prothrombin Time: 14.9 s (ref 11.4–15.2)

## 2024-03-20 LAB — TROPONIN T, HIGH SENSITIVITY: Troponin T High Sensitivity: <15 ng/L (ref 0–19)

## 2024-03-20 LAB — GLUCOSE, CAPILLARY
Glucose-Capillary: 90 mg/dL (ref 70–99)
Glucose-Capillary: 100 mg/dL — ABNORMAL HIGH (ref 70–99)
Glucose-Capillary: 89 mg/dL (ref 70–99)
Glucose-Capillary: 117 mg/dL — ABNORMAL HIGH (ref 70–99)
Glucose-Capillary: 107 mg/dL — ABNORMAL HIGH (ref 70–99)

## 2024-03-20 LAB — PROCALCITONIN: Procalcitonin: <0.1 ng/mL

## 2024-03-20 LAB — HIV ANTIBODY (ROUTINE TESTING W REFLEX): HIV Screen 4th Generation wRfx: NONREACTIVE

## 2024-03-20 LAB — MRSA NEXT GEN BY PCR, NASAL: MRSA by PCR Next Gen: NOT DETECTED

## 2024-03-20 LAB — MAGNESIUM: Magnesium: 2.5 mg/dL — ABNORMAL HIGH (ref 1.7–2.4)

## 2024-03-20 LAB — LIPASE, BLOOD: Lipase: 33 U/L (ref 11–51)

## 2024-03-20 LAB — TSH: TSH: 0.973 u[IU]/mL (ref 0.350–4.500)

## 2024-03-20 LAB — ABO/RH: ABO/RH(D): O POS

## 2024-03-20 LAB — LACTIC ACID, PLASMA
Lactic Acid, Venous: 1.8 mmol/L (ref 0.5–1.9)
Lactic Acid, Venous: 2.7 mmol/L (ref 0.5–1.9)
Lactic Acid, Venous: 1.7 mmol/L (ref 0.5–1.9)
Lactic Acid, Venous: 1.6 mmol/L (ref 0.5–1.9)

## 2024-03-20 MED ORDER — OXYCODONE HCL 5 MG PO TABS
5.0000 mg | ORAL_TABLET | Freq: Four times a day (QID) | ORAL | Status: DC | PRN
  Administered 2024-03-20 – 2024-03-22 (×5): 5 mg via ORAL
  Filled 2024-03-20 (×5): qty 1

## 2024-03-20 MED ORDER — SODIUM CHLORIDE 0.9 % IV SOLN: 250.0000 mL | INTRAVENOUS | Status: AC

## 2024-03-20 MED ORDER — SODIUM CHLORIDE 0.9 % IV SOLN
10.0000 mL/h | Freq: Once | INTRAVENOUS | Status: AC
  Administered 2024-03-20: 10 mL/h via INTRAVENOUS

## 2024-03-20 MED ORDER — PANTOPRAZOLE SODIUM 40 MG IV SOLR: 40.0000 mg | Freq: Two times a day (BID) | INTRAVENOUS | Status: DC

## 2024-03-20 MED ORDER — CHLORHEXIDINE GLUCONATE CLOTH 2 % EX PADS
6.0000 | MEDICATED_PAD | Freq: Every day | CUTANEOUS | Status: DC
  Administered 2024-03-20 – 2024-03-21 (×2): 6 via TOPICAL

## 2024-03-20 MED ORDER — ACETAMINOPHEN 500 MG PO TABS
1000.0000 mg | ORAL_TABLET | ORAL | Status: AC
  Administered 2024-03-20: 1000 mg via ORAL
  Filled 2024-03-20: qty 2

## 2024-03-20 MED ORDER — ACETAMINOPHEN 10 MG/ML IV SOLN
1000.0000 mg | Freq: Four times a day (QID) | INTRAVENOUS | Status: AC | PRN
  Administered 2024-03-20: 1000 mg via INTRAVENOUS
  Filled 2024-03-20: qty 100

## 2024-03-20 MED ORDER — POLYETHYLENE GLYCOL 3350 17 G PO PACK
17.0000 g | PACK | Freq: Every day | ORAL | Status: DC | PRN
  Administered 2024-03-20: 17 g via ORAL
  Filled 2024-03-20: qty 1

## 2024-03-20 MED ORDER — SODIUM CHLORIDE 0.9 % IV SOLN: Freq: Once | INTRAVENOUS | Status: DC

## 2024-03-20 MED ORDER — PANTOPRAZOLE SODIUM 40 MG IV SOLR
40.0000 mg | INTRAVENOUS | Status: DC
  Administered 2024-03-20 – 2024-03-21 (×2): 40 mg via INTRAVENOUS
  Filled 2024-03-20 (×2): qty 10

## 2024-03-20 MED ORDER — LACTATED RINGERS IV SOLN: INTRAVENOUS | Status: AC

## 2024-03-20 MED ORDER — NOREPINEPHRINE 4 MG/250ML-% IV SOLN
0.0000 ug/min | INTRAVENOUS | Status: DC
  Administered 2024-03-20: 2 ug/min via INTRAVENOUS
  Filled 2024-03-20: qty 250

## 2024-03-20 MED ORDER — FENTANYL CITRATE (PF) 50 MCG/ML IJ SOSY: 25.0000 ug | PREFILLED_SYRINGE | INTRAMUSCULAR | Status: DC | PRN

## 2024-03-20 MED ORDER — SODIUM CHLORIDE 0.9 % IV BOLUS
2000.0000 mL | Freq: Once | INTRAVENOUS | Status: AC
  Administered 2024-03-20: 2000 mL via INTRAVENOUS

## 2024-03-20 MED ORDER — HYDROMORPHONE HCL 1 MG/ML IJ SOLN
0.5000 mg | Freq: Once | INTRAMUSCULAR | Status: AC
  Administered 2024-03-20: 0.5 mg via INTRAVENOUS
  Filled 2024-03-20: qty 1

## 2024-03-20 MED ORDER — DOCUSATE SODIUM 100 MG PO CAPS: 100.0000 mg | ORAL_CAPSULE | Freq: Two times a day (BID) | ORAL | Status: DC | PRN

## 2024-03-20 NOTE — Plan of Care (Signed)
  Problem: Education: Goal: Knowledge of General Education information will improve Description: Including pain rating scale, medication(s)/side effects and non-pharmacologic comfort measures Outcome: Progressing   Problem: Health Behavior/Discharge Planning: Goal: Ability to manage health-related needs will improve Outcome: Progressing   Problem: Activity: Goal: Risk for activity intolerance will decrease Outcome: Progressing   Problem: Pain Managment: Goal: General experience of comfort will improve and/or be controlled Outcome: Progressing   Problem: Safety: Goal: Ability to remain free from injury will improve Outcome: Progressing   Problem: Skin Integrity: Goal: Risk for impaired skin integrity will decrease Outcome: Progressing

## 2024-03-20 NOTE — ED Notes (Signed)
 CCMD called for cardiac monitoring.

## 2024-03-20 NOTE — ED Triage Notes (Addendum)
 Pt arrived from home via [facility] d/t loss of consciousness. EMS reports pt has been having a headache for about a week but got worse yesterday. EMS reports that pt fainted in hallway and that husband helped to get pt back into bed. EMS reports pt was pale upon arrival. Pt AO x4 upon arrival.

## 2024-03-20 NOTE — Progress Notes (Signed)
 PHARMACY CONSULT NOTE - FOLLOW UP  Pharmacy Consult for Electrolyte Monitoring and Replacement   Recent Labs: Potassium (mmol/L)  Date Value  03/20/2024 5.1   Magnesium (mg/dL)  Date Value  87/90/7974 2.5 (H)   Calcium  (mg/dL)  Date Value  87/90/7974 8.8 (L)   Albumin (g/dL)  Date Value  87/90/7974 3.6   Phosphorus (mg/dL)  Date Value  87/90/7974 3.1   Sodium (mmol/L)  Date Value  03/20/2024 138     Assessment: 58 year old female with history of hypertension, recently started on ARB presenting with syncopal episode. Other PMH of thrombocytosis, iron deficiency, intractable HAs Pharmacy is asjed to follow and replace electrolytes while in CCU  Goal of Therapy:  Electrolytes WNL  Plan:  ---no electrolyte replacement warranted for now ---recheck BMP at 1500 then again in am  Adriana JONETTA Bolster ,PharmD Clinical Pharmacist 03/20/2024 7:06 AM

## 2024-03-20 NOTE — H&P (Signed)
 NAME:  Kelly Long, MRN:  [id number], DOB:  1966-03-04, LOS: 0 ADMISSION DATE:  03/20/2024, CONSULTATION DATE:  03/20/24 REFERRING MD:  Dr. Dicky, CHIEF COMPLAINT:  syncope & hypotension  History of Present Illness:  58 yo F presenting to [facility] ED from home via EMS for evaluation of of syncope and shock.  History obtained per chart review and patient bedside report. She was in her normal state of health until last week around 12/3 when she began feeling increasingly fatigued. This was also around the same time she began taking her increased dose of olmesartan, which she reports switching to 6-7 days ago. Then over the weekend the patient began feeling lightheaded and cold. Overnight 12/8-12/9 she woke up her husband to help her to the bathroom. While on the toilet she had a syncopal episode, hitting her head on the floor. He helped her back to bed. Where she had a second syncopal episode prompting her husband to call EMS. The patient endorses a headache with mild left sided cheek and neck pain as well as slightly increased chronic pain in her left lower abdomen with tightness down her leg as well as some dyspnea with EMS that has resolved. She denies any changes to her diet or fluid intake recently. She denies fever, chest pain, nausea/ vomiting/ diarrhea.  EMS found the patient severely hypotensive with SBP in the 50's, administering 1 L bolus en route.  ED course: Upon arrival the patient was alert and responsive but feeling weak and light-headed, even lying down. Vitals reveal circulatory shock but are otherwise stable. Labs significant for AKI, mild NAGMA & anemia. Imaging WNL. Medications given: 2 L IVF bolus, 1 unit of emergent blood, 1g Tylenol  Initial Vitals: 98.1, 16, 84, 63/41 & 100% on RA  Significant labs:  I, Jenita Ruth Rust-Chester, AGACNP-BC, personally viewed and interpreted this ECG. EKG Interpretation: Date: 03/20/24, EKG Time: 01:46, Rate: 88, Rhythm: NSR, QRS Axis:  normal,  Intervals: normal, ST/T Wave abnormalities: none, Narrative Interpretation: NSR  (Labs/ Imaging personally reviewed) Chemistry: Na+:138, K+: 4.3, BUN/Cr.: 49/4.61, Serum CO2/ AG: 21/12 Hematology: WBC: 8.8, Hgb: 9.8, platelet: 462  Troponin: <15, Lactic:1.8  CXR 03/20/24: no acute process CT head wo contrast 03/20/24: No acute intracranial abnormality. CT cervical wo contrast 03/20/24: No acute abnormality of the cervical spine. 2. No CT evidence of surgical hardware complication. CT maxillofacial wo contrast 03/20/24:  No acute facial fracture. 3. Right mandibular molar periapical lucencies and dental caries CT abdomen/pelvis wo contrast 03/20/24:  No acute findings in the abdomen or pelvis with limited evaluation of this non contrast study.  PCCM consulted for admission due to circulatory shock & AKI suspect medication induced requiring vasopressor support.  Pertinent  Medical History  Migraines HTN Left sided lumbar radiculopathy T2DM  Significant Hospital Events: Including procedures, antibiotic start and stop dates in addition to other pertinent events   03/20/24: Admit to ICU due to circulatory shock & AKI suspect medication induced requiring vasopressor support  Interim History / Subjective:  Patient alert and oriented with husband bedside. Plan of care discussed and all questions and concerns answered at this time.  Objective    Blood pressure (!) 99/53, pulse 87, temperature 97.6 F (36.4 C), temperature source Oral, resp. rate 17, height 5' 4 (1.626 m), weight 109.4 kg, SpO2 100%.       No intake or output data in the 24 hours ending 03/20/24 0336 Filed Weights   03/20/24 0054  Weight: 109.4 kg    Examination:  General: Adult female, critically ill, lying in bed, NAD HEENT: MM pink/moist, anicteric, left sided facial edema from fall and tenderness with palpation, neck supple Neuro: A&O x 4, able to follow commands, PERRL +3, MAE CV: s1s2 RRR, NSR on monitor, no  r/m/g Pulm: Regular, non labored on RA , breath sounds clear-BUL & clear/diminished-BLL GI: soft, rounded, non tender, bs x 4 GU: foley in place with clear yellow urine Skin: no no rashes/lesions noted Extremities: warm/dry, pulses + 2 R/P, no edema noted  Resolved problem list   Assessment and Plan  Circulatory Shock s/t suspect medication induced Syncope PMHx: HTN - continue levophed , wean as tolerated to maintain MAP > 65 - additional IV hydration PRN - continuous cardiac monitoring - hold outpatient anti-hypertensive medications at this time - echocardiogram ordered  Acute Kidney Injury suspect medication induced in the setting of recent increase of Olmesartan Baseline Cr: 1.02, Cr on admission:4.61 - Strict I/O's: alert provider if UOP < 0.5 mL/kg/hr - gentle IVF hydration  - Daily BMP, replace electrolytes PRN - Avoid nephrotoxic agents as able, ensure adequate renal perfusion - Consider nephrology consultation if AKI worsens  Type 2 Diabetes Mellitus Hemoglobin A1C: pending - Monitor CBG Q 4 hours - consider adding SSI coverage if CBG > 180 consistently - hold outpatient Mounjaro - target range while in ICU: 140-180 - follow ICU hyper/hypo-glycemia protocol  Labs   CBC: Recent Labs  Lab 03/20/24 0106  WBC 8.8  NEUTROABS 5.1  HGB 9.8*  HCT 30.2*  MCV 87.8  PLT 462*    Basic Metabolic Panel: Recent Labs  Lab 03/20/24 0106  NA 138  K 4.3  CL 104  CO2 21*  GLUCOSE 102*  BUN 49*  CREATININE 4.61*  CALCIUM  8.7*   GFR: Estimated Creatinine Clearance: 15.9 mL/min (A) (by C-G formula based on SCr of 4.61 mg/dL (H)). Recent Labs  Lab 03/20/24 0106  WBC 8.8  LATICACIDVEN 1.8    Liver Function Tests: Recent Labs  Lab 03/20/24 0106  AST 18  ALT 12  ALKPHOS 83  BILITOT <0.2  PROT 6.4*  ALBUMIN 3.6   Recent Labs  Lab 03/20/24 0106  LIPASE 33   No results for input(s): AMMONIA in the last 168 hours.  ABG No results found for: PHART,  PCO2ART, PO2ART, HCO3, TCO2, ACIDBASEDEF, O2SAT   Coagulation Profile: Recent Labs  Lab 03/20/24 0106  INR 1.1    Cardiac Enzymes: No results for input(s): CKTOTAL, CKMB, CKMBINDEX, TROPONINI in the last 168 hours.  HbA1C: No results found for: HGBA1C  CBG: No results for input(s): GLUCAP in the last 168 hours.  Review of Systems: positives in BOLD  Gen: Denies fever, chills, weight change, fatigue, night sweats HEENT: Denies blurred vision, double vision, hearing loss, tinnitus, sinus congestion, rhinorrhea, sore throat, neck stiffness, dysphagia PULM: Denies shortness of breath, cough, sputum production, hemoptysis, wheezing CV: Denies chest pain, edema, orthopnea, paroxysmal nocturnal dyspnea, palpitations GI: Denies abdominal pain, nausea, vomiting, diarrhea, hematochezia, melena, constipation, change in bowel habits GU: Denies dysuria, hematuria, polyuria, oliguria, urethral discharge Endocrine: Denies hot or cold intolerance, polyuria, polyphagia or appetite change Derm: Denies rash, dry skin, scaling or peeling skin change Heme: Denies easy bruising, bleeding, bleeding gums Neuro: Denies headache, numbness, weakness, slurred speech, loss of memory or consciousness  Past Medical History:  She,  has a past medical history of Chronic left-sided lumbar radiculopathy, HTN (hypertension), Migraine, and T2DM (type 2 diabetes mellitus) (HCC).   Surgical History:  History reviewed. No  pertinent surgical history.   Social History:   reports that she has never smoked. She does not have any smokeless tobacco history on file. She reports that she does not drink alcohol.   Family History:  Her family history is not on file.   Allergies Allergies  Allergen Reactions   Latex Swelling   Shellfish Allergy Swelling     Home Medications  Prior to Admission medications   Medication Sig Start Date End Date Taking? Authorizing Provider  famotidine  (PEPCID )  20 MG tablet Take 1 tablet (20 mg total) by mouth daily. 03/18/21 03/18/22  Cuthriell, Dorn BIRCH, PA-C  GI Cocktail (alum & mag hydroxide-simethicone /lidocaine )oral mixture Take 15 mLs by mouth 2 (two) times daily as needed (heartburn). 03/18/21   Cuthriell, Dorn BIRCH, PA-C  omeprazole  (PRILOSEC) 10 MG capsule Take 1 capsule (10 mg total) by mouth daily. 03/18/21   Cuthriell, Dorn BIRCH, PA-C  rizatriptan (MAXALT-MLT) 10 MG disintegrating tablet Take 10 mg by mouth as needed for migraine. May repeat in 2 hours if needed    [provider]     Critical care time: 34 minutes       Jenita Jama Meek, AGACNP-BC Acute Care Nurse Practitioner [facility]   [phone] / [phone] Please see Amion for details.

## 2024-03-20 NOTE — ED Provider Notes (Signed)
 [facility] Provider Note    Event Date/Time   First MD Initiated Contact with Patient 03/20/24 0050     (approximate)   History   Loss of Consciousness  EM caveat severe hypotension  HPI  Kelly Long is a 58 y.o. female who according to records from [facility] has a history of thrombocytosis and iron deficiency history of previous iron infusions.   Patient reports since Saturday been feeling lightheaded a little short of breath.  She had a episode where she passed out this evening while she was standing.  She reports her chin feels little bit sore but otherwise denies any other injury.  She has noticed that she has been going to the bathroom stooling a little more frequently the last couple days but has not looked in the toilet to see if it is of any dark or bloody stool.  She not aware of any pain or discomfort no fevers or chills no shortness of breath no chest pain, except she has been she is short of breath last couple of days when she tries to stand up walk or do anything  She relates she had similar symptoms in the past once at [facility] and at 1 point required a blood transfusion and iron infusion  Physical Exam   Triage Vital Signs: ED Triage Vitals  Encounter Vitals Group     BP 03/20/24 0053 (!) 63/41     Girls Systolic BP Percentile --      Girls Diastolic BP Percentile --      Boys Systolic BP Percentile --      Boys Diastolic BP Percentile --      Pulse Rate 03/20/24 0053 (!) 4     Resp 03/20/24 0053 16     Temp 03/20/24 0053 98.1 F (36.7 C)     Temp Source 03/20/24 0053 Oral     SpO2 03/20/24 0049 97 %     Weight 03/20/24 0054 241 lb 1.6 oz (109.4 kg)     Height 03/20/24 0054 5' 4 (1.626 m)     Head Circumference --      Peak Flow --      Pain Score 03/20/24 0053 9     Pain Loc --      Pain Education --      Exclude from Growth Chart --     Most recent vital signs: Vitals:   03/20/24 0145 03/20/24 0155  BP: (!) 87/52 (!) 99/53   Pulse: 88 87  Resp: 14 17  Temp:  97.6 F (36.4 C)  SpO2: 100% 100%     General: Awake, no distress.  She is in no distress but appears slightly pale.  Severely hypotensive systolic blood pressure in the mid 60s. CV:  Good peripheral perfusion.  Normal tone Resp:  Normal effort.  Clear bilateral Abd:  No distention.  Soft nontender nondistended throughout Rectal exam performed by myself with nursing staff present.  Brown formed stool.  Hemoccult negative.  No obvious bleeding Other:     ED Results / Procedures / Treatments   Labs (all labs ordered are listed, but only abnormal results are displayed) Labs Reviewed  COMPREHENSIVE METABOLIC PANEL WITH GFR - Abnormal; Notable for the following components:      Result Value   CO2 21 (*)    Glucose, Bld 102 (*)    BUN 49 (*)    Creatinine, Ser 4.61 (*)    Calcium  8.7 (*)    Total Protein  6.4 (*)    GFR, Estimated 10 (*)    All other components within normal limits  CBC WITH DIFFERENTIAL/PLATELET - Abnormal; Notable for the following components:   RBC 3.44 (*)    Hemoglobin 9.8 (*)    HCT 30.2 (*)    Platelets 462 (*)    All other components within normal limits  CULTURE, BLOOD (SINGLE)  MRSA NEXT GEN BY PCR, NASAL  LACTIC ACID, PLASMA  PROTIME-INR  LIPASE, BLOOD  LACTIC ACID, PLASMA  URINALYSIS, W/ REFLEX TO CULTURE (INFECTION SUSPECTED)  HIV ANTIBODY (ROUTINE TESTING W REFLEX)  CBC  MAGNESIUM  PHOSPHORUS  BASIC METABOLIC PANEL WITH GFR  PREPARE RBC (CROSSMATCH)  TYPE AND SCREEN  ABO/RH  TROPONIN T, HIGH SENSITIVITY   RADIOLOGY  Chest x-ray inter by me is negative for acute   CT of the head as well as abdomen pelvis without contrast are pending.  ICU team will follow-up on these results   PROCEDURES:  Critical Care performed: Yes, see critical care procedure note(s)  CRITICAL CARE Performed by: Oneil Budge   Total critical care time: 45 minutes  Critical care time was exclusive of separately billable  procedures and treating other patients.  Critical care was necessary to treat or prevent imminent or life-threatening deterioration.  Critical care was time spent personally by me on the following activities: development of treatment plan with patient and/or surrogate as well as nursing, discussions with consultants, evaluation of patient's response to treatment, examination of patient, obtaining history from patient or surrogate, ordering and performing treatments and interventions, ordering and review of laboratory studies, ordering and review of radiographic studies, pulse oximetry and re-evaluation of patient's condition.   Procedures   MEDICATIONS ORDERED IN ED: Medications  0.9 %  sodium chloride  infusion (has no administration in time range)  Chlorhexidine  Gluconate Cloth 2 % PADS 6 each (has no administration in time range)  docusate sodium  (COLACE) capsule 100 mg (has no administration in time range)  polyethylene glycol (MIRALAX  / GLYCOLAX ) packet 17 g (has no administration in time range)  sodium chloride  0.9 % bolus 2,000 mL (0 mLs Intravenous Stopped 03/20/24 0118)  acetaminophen  (TYLENOL ) tablet 1,000 mg (1,000 mg Oral Given 03/20/24 0201)     IMPRESSION / MDM / ASSESSMENT AND PLAN / ED COURSE  I reviewed the triage vital signs and the nursing notes.                              Differential diagnosis includes, but is not limited to, severe hypotension with shock.  Patient has an obvious shock index.  Syncope with persistent hypotension with EMS despite receiving 1 L of fluid prehospital.  On due to her severe hypotension we have initiated IV fluid on bilateral pressure bags warmed IV fluid and have empirically ordered 1 unit of emergency release O- blood based on her previous history of somewhat similar presentations in the suspicion for possible severe anemia.  She does not have any obvious frank blood on rectal at this time she denies any pain or discomfort no chest pain she  is fully alert and oriented but with multiple blood pressure attempts obviously having persistent hypotension.  This preceded by a syncopal episode while standing.  She does not take any anticoagulants she reports that she did strike her face and feels a little bit sore around her lip but normocephalic atraumatic fully alert oriented.  Occurred from a ground-level.  She has no  findings that would be highly suggestive of acute intracranial hemorrhage  Broad workup thus far not febrile she denies any obvious preceding infectious symptoms abdominal pain pain or burning with urination cough shortness of breath etc. making me less apt to think this is due to sepsis but other crevices such as metabolic dehydration cardiac, etc. strongly considered.  No hypoxia no tachycardia no chest pain no findings to be highly suggestive of thromboembolism.  Differential being quite broad at this time    Patient's presentation is most consistent with acute presentation with potential threat to life or bodily function.   The patient is on the cardiac monitor to evaluate for evidence of arrhythmia and/or significant heart rate changes.   Clinical Course as of 03/20/24 0243  Tue Mar 20, 2024  0110 Reviewed [facility] records patient has had similar symptoms with lightheadedness, required blood transfusion.  At that time she apparently had some bright red blood per rectum she had noticed.  Based on her symptoms and presentation her previous history severe hypotension I discussed with the patient risks benefits and alternatives at this time we will proceed with giving her 1 unit of emergency release blood.  We are awaiting the rest of her labs to come back.  Patient agreeable.  She consents verbally and will sign form.  Await further workup differential being broad but based on previous history and highly suspicious she may have severe anemia [MQ]  0145 Awaiting chemistry and additional lab panels but at this juncture her  hemoglobin is now resulted at 9.8 and given she does not have notable blood in her stool at this point I do not think there is obvious finding of active bleeding.  She received 1 unit of blood and remains mildly hypotensive but significantly improving from initial presentation.  Fully alert and oriented.  Will continue IV fluid to obtain MAP greater than 65 but I do not think further transfusion is indicated at this time.  Will start pursuing other causes of shock such as rule out PE, evaluate for acute intra-abdominal pathology, urinalysis, rule out sepsis to pathology etc.  Awaiting ECG.  Troponin etc.  Patient improving but cause of shock is not yet clear.  Medication list reviewed no obvious medications that would cause acute shock like finding.  Heart rate is not significantly elevated. [MQ]  0147 To this point patient has received 3 L of normal saline, including 1 prehospital.  1 unit of blood.  Blood pressure now improving MAP 63.  She has had adequate fluid resuscitation will await labs.  Continue to monitor blood pressures further if she starts having further reduction in blood pressures may consider starting pressor [MQ]  0151 Chest x-ray interpreted as clear, some underpenetration [MQ]  0151 Have sent ICU consult, stat page to [facility] for ICU consult/admit evaluation.  [MQ]  0212 Waiting for the labs and chemistry panel lactic acid normal.  Blood pressure is improving.  ICU team to see and evaluate.  Currently pending imaging to evaluate for causes of hypotension.  She is responding well to therapy at this point.  Blood pressure checked on both arms and shows no deficit between 1 side of the other.  Appears to have true hypotension which is improving responding well to therapy at this point.  Awaiting evaluation by ICU team.  Awaiting imaging studies and chemistry panel [MQ]  0213 BP(!): 99/53 Reevaluation, improved.  Nothing to suggest sepsis to this point [MQ]  0213 EKG interpreted by me  at  150 heart rate 90 QRS 90 QTc 440 normal sinus rhythm.  No evidence of acute ischemia. [MQ]  0241 Labs demonstrative of severe renal impairment from baseline.  Evidence of AKI.  She has responded well blood pressure is improving.  She is admitted to the ICU at this time.  Doubt acute chest pathology now is reviewed her labs, will obtain CT imaging of the abdomen pelvis without contrast in the head evaluating for renal disease post renal obstruction, and also given her fall making certain no intracranial hemorrhage this seems highly unlikely as she appears normocephalic atraumatic not anticoagulated.  Other considerations such as ischemia hypotension, possibly unrecognized persistent hypotension associated with use of her blood pressure medicine etc. are all considered.  She admitted to the ICU with further care and workup. [MQ]    Clinical Course User Index [MQ] Dicky Anes, MD     FINAL CLINICAL IMPRESSION(S) / ED DIAGNOSES   Final diagnoses:  Shock (HCC)  AKI (acute kidney injury)  Syncope and collapse     Rx / DC Orders   ED Discharge Orders     None        Note:  This document was prepared using Dragon voice recognition software and may include unintentional dictation errors.   Dicky Anes, MD 03/20/24 [phone]

## 2024-03-20 NOTE — Progress Notes (Signed)
 eLink Physician-Brief Progress Note Patient Name: Kelly Long DOB: [birth year] MRN: [id number]   Date of Service  03/20/2024  HPI/Events of Note  58/F with history of  thrombocytosis, iron deficiency, presenting with lightheadedness and shortness of breath. She relates onset of symptoms to increase in dose of her olmesartan. Symptoms had continued to progress, and she was brought to the hospital following a syncopal episode at home. On evaluation in the ED, she was noted to be hypotensive with SBP down to the 70s. She was given fluids, empirically transfused a unit of PRBC for possible acute anemia (given her prior history). However, subsequent labs were mostly unremarkable. She had been given multiple boluses, but BP remains borderline low. Pt started on low dose levophed , admitted to the ICU.   Labs and imaging briefly reviewed WBC 8.8, Hgb 9.8, plt 462 Na 138, K 4.3, HCO 21, BUN 49, Cr 4.61 CXR - no acute process CT abdomen+pelvis - no acute findings in the abdomen or pelvis with limited evaluation of this non-contrast study.  CT head - no acute abnormalities   eICU Interventions  Hypotension, etiology unclear - ?Medication related   - Will hold home antihypertensives for now - ?Hypovolemic   - Pt already received 3L normal saline in boluses and has been transfused 1 unit pRBC   - Rectal exam was reported to be negative. No clear evidence of GI bleed.    - Started on PPI.    - Will continue to monitor H/H.  - ?Distributive/septic   - No fever, no leukocytosis. Lactate 1.8   - No findings on imaging thus far to suggest ongoing infection   - Follow up cultures, urinalysis   - Added on procalcitonin   - Will monitor off antibiotics for now.  - ?Obstructive   - No tension pneumothorax noted on CXR.    - Heart not enlarged. No large pericardial effusion seen on visible portion of the hear seen on CT.    - ?PE - cannot obtain CT with contrast given renal dysfunction. Will plan to  obtain Echo, LE DVT in the meantime.  - ?Cardiogenic   - No ST segment elevations on EKG. Troponin negative.    - Will follow up echo results.  - Will continue on levophed  in the meantime to maintain MAP >65   AKI - Probably secondary to hypovolemia, decreased renal perfusion - Continue IVF. Monitor I/Os - Target MAP >65 - Monitor electrolytes, correct abnormalities as warranted - Will renally dose medications, avoid nephrotoxins.  - No indication for emergent HD at this time.  - Plan for nephrology consult in AM.         Iraida Cragin M DELA CRUZ 03/20/2024, 4:26 AM

## 2024-03-21 ENCOUNTER — Other Ambulatory Visit: Payer: Self-pay

## 2024-03-21 LAB — TYPE AND SCREEN
ABO/RH(D): O POS
Antibody Screen: NEGATIVE
Unit division: 0
Unit division: 0
Unit division: 0

## 2024-03-21 LAB — CBC
HCT: 38.4 % (ref 36.0–46.0)
Hemoglobin: 12.3 g/dL (ref 12.0–15.0)
MCH: 28.7 pg (ref 26.0–34.0)
MCHC: 32 g/dL (ref 30.0–36.0)
MCV: 89.5 fL (ref 80.0–100.0)
Platelets: 518 K/uL — ABNORMAL HIGH (ref 150–400)
RBC: 4.29 MIL/uL (ref 3.87–5.11)
RDW: 14.5 % (ref 11.5–15.5)
WBC: 7.7 K/uL (ref 4.0–10.5)
nRBC: 0 % (ref 0.0–0.2)

## 2024-03-21 LAB — BASIC METABOLIC PANEL WITH GFR
Anion gap: 10 (ref 5–15)
BUN: 19 mg/dL (ref 6–20)
CO2: 24 mmol/L (ref 22–32)
Calcium: 9.2 mg/dL (ref 8.9–10.3)
Chloride: 103 mmol/L (ref 98–111)
Creatinine, Ser: 1.39 mg/dL — ABNORMAL HIGH (ref 0.44–1.00)
GFR, Estimated: 43 mL/min — ABNORMAL LOW (ref >60)
Glucose, Bld: 106 mg/dL — ABNORMAL HIGH (ref 70–99)
Potassium: 4.9 mmol/L (ref 3.5–5.1)
Sodium: 138 mmol/L (ref 135–145)

## 2024-03-21 LAB — PREPARE RBC (CROSSMATCH)

## 2024-03-21 LAB — GLUCOSE, CAPILLARY
Glucose-Capillary: 80 mg/dL (ref 70–99)
Glucose-Capillary: 127 mg/dL — ABNORMAL HIGH (ref 70–99)
Glucose-Capillary: 110 mg/dL — ABNORMAL HIGH (ref 70–99)
Glucose-Capillary: 95 mg/dL (ref 70–99)
Glucose-Capillary: 121 mg/dL — ABNORMAL HIGH (ref 70–99)

## 2024-03-21 LAB — BPAM RBC
Blood Product Expiration Date: 202601122359
Blood Product Expiration Date: 202601122359
ISSUE DATE / TIME: 202512080116
ISSUE DATE / TIME: 202512080116
Unit Type and Rh: 5100
Unit Type and Rh: 5100

## 2024-03-21 LAB — MAGNESIUM: Magnesium: 2.2 mg/dL (ref 1.7–2.4)

## 2024-03-21 LAB — PHOSPHORUS: Phosphorus: 3.1 mg/dL (ref 2.5–4.6)

## 2024-03-21 MED ORDER — SALINE SPRAY 0.65 % NA SOLN: 1.0000 | NASAL | Status: DC | PRN

## 2024-03-21 MED ORDER — CARMEX CLASSIC LIP BALM EX OINT: 1.0000 | TOPICAL_OINTMENT | CUTANEOUS | Status: DC | PRN

## 2024-03-21 MED ORDER — ROSUVASTATIN CALCIUM 10 MG PO TABS
5.0000 mg | ORAL_TABLET | Freq: Every day | ORAL | Status: DC
  Administered 2024-03-21 – 2024-03-22 (×2): 5 mg via ORAL
  Filled 2024-03-21 (×2): qty 1

## 2024-03-21 MED ORDER — ALUM & MAG HYDROXIDE-SIMETH 200-200-20 MG/5ML PO SUSP: 30.0000 mL | ORAL | Status: DC | PRN

## 2024-03-21 MED ORDER — SUMATRIPTAN SUCCINATE 50 MG PO TABS
100.0000 mg | ORAL_TABLET | ORAL | Status: DC | PRN
  Administered 2024-03-21: 100 mg via ORAL
  Filled 2024-03-21 (×2): qty 2

## 2024-03-21 MED ORDER — MUSCLE RUB 10-15 % EX CREA: 1.0000 | TOPICAL_CREAM | CUTANEOUS | Status: DC | PRN

## 2024-03-21 MED ORDER — HEPARIN SODIUM (PORCINE) 5000 UNIT/ML IJ SOLN
5000.0000 [IU] | Freq: Two times a day (BID) | INTRAMUSCULAR | Status: DC
  Administered 2024-03-21 – 2024-03-22 (×2): 5000 [IU] via SUBCUTANEOUS
  Filled 2024-03-21 (×3): qty 1

## 2024-03-21 MED ORDER — ONDANSETRON HCL 4 MG/2ML IJ SOLN
4.0000 mg | Freq: Three times a day (TID) | INTRAMUSCULAR | Status: DC | PRN
  Administered 2024-03-21 – 2024-03-22 (×2): 4 mg via INTRAVENOUS
  Filled 2024-03-21 (×2): qty 2

## 2024-03-21 NOTE — Plan of Care (Signed)
  Problem: Education: Goal: Knowledge of General Education information will improve Description: Including pain rating scale, medication(s)/side effects and non-pharmacologic comfort measures Outcome: Progressing   Problem: Clinical Measurements: Goal: Ability to maintain clinical measurements within normal limits will improve Outcome: Progressing Goal: Will remain free from infection Outcome: Progressing Goal: Cardiovascular complication will be avoided Outcome: Progressing

## 2024-03-21 NOTE — Progress Notes (Signed)
 PROGRESS NOTE    Kelly Long  FMW:[id number] DOB: [birth year] DOA: 03/20/2024 PCP: System, Provider Not In  Chief Complaint  Patient presents with   Loss of Consciousness    Hospital Course:  Kelly Long is a 58 year old female with history of thrombocytosis, hypertension, migraine headaches, and iron deficiency who presented on after a syncopal episode at home.  Patient endorses having frequent diarrhea over the last week since initiating olmesartan. On EMS arrival SBP was in the 50s, she received 1 L NS bolus and route.  She was initially admitted to the ICU for pressor support, and treated for circulatory shock and AKI. She received pan scan given syncopal episode which did not reveal any acute abnormalities. She was found to have an AKI with a creatinine of 4.61. She was transferred to [facility] service on 12/10.  Blood pressure and AKI improving.  Subjective: This morning patient reports no bowel movements yet today.  Last bowel movement was yesterday and she reports it was more formed than it has been. She is having a migraine this morning and would like her home triptan.   Objective: Vitals:   03/20/24 2345 03/21/24 0406 03/21/24 0725 03/21/24 1203  BP: 108/69 127/78 139/69 122/85  Pulse: 91 92 91 92  Resp: 17 18 17 18   Temp: 97.9 F (36.6 C) 97.9 F (36.6 C) 98 F (36.7 C) 98.1 F (36.7 C)  TempSrc: Oral Oral Oral Oral  SpO2: 98% 99% 99%   Weight: 109.1 kg 109 kg    Height: 5' 4 (1.626 m)       Intake/Output Summary (Last 24 hours) at 03/21/2024 1307 Last data filed at 03/20/2024 2018 Gross per 24 hour  Intake 390 ml  Output 1800 ml  Net -1410 ml   Filed Weights   03/20/24 0500 03/20/24 2345 03/21/24 0406  Weight: 111.1 kg 109.1 kg 109 kg    Examination: General exam: Appears calm and comfortable, NAD  Respiratory system: No work of breathing, symmetric chest wall expansion Cardiovascular system: S1 & S2 heard, RRR.  Gastrointestinal system: Abdomen is  nondistended, soft and nontender.  Neuro: Alert and oriented. No focal neurological deficits. Extremities: Symmetric, expected ROM Skin: No rashes, lesions Psychiatry: Demonstrates appropriate judgement and insight. Mood & affect appropriate for situation.   Assessment & Plan:  Principal Problem:   Shock circulatory (HCC) Active Problems:   AKI (acute kidney injury)   Syncope and collapse    Hypovolemic shock - Likely secondary to recent diarrhea complicated by AKI and new blood pressure medication - Required vasopressors in the ICU - Now off pressors and BP stable - PT/OT evals ordered  Syncope - Secondary to hypotension - Continue with cardiac monitoring - Orthostatics ordered have asked RN to perform - Echocardiogram: EF 65 to 70%, no regional wall motion abnormalities, diastolic parameters WNL - No significant valvular abnormality -- Initial EKG with sinus tach  Hypertension - Hold all antihypertensives for now given hypotension - Gradually resume antihypertensives as needed, suggest amlodipine when needed - Do not prescribe ARB's.  Have added this as an allergy  AKI - Secondary to hypotension and dehydration from diarrhea - Baseline creatinine near 1, rose to 4.61 on arrival.  Has been gradually downtrending.  1.39 this morning.  Migraine headaches - As needed sumatriptan   Type 2 diabetes - Taking Mounjaro outpatient (been on for >6 months, unlikely contributing to this presentation, hold for now - Sliding scale insulin - Hemoglobin A1c pending  Diarrhea - Resolving now - Only new  medication is olmesartan which was added on 11/18 and patient believes that this is related to the diarrhea - Of note she is also taking indomethacin and Reglan but has been on these medications for over 6 months, holding for now  Body mass index is 41.25 kg/m. Obesity Class III - Outpatient follow up for lifestyle modification and risk factor management  Thrombocytosis -  Chronic.  Stable  Arthritis Radiculopathy - Hold off on pain meds for now to avoid further blood pressure changes.  Gradually resume as tolerated  DVT prophylaxis:  heparin    Code Status: Full Code Disposition: Pending resolution and creatinine, PT and OT evals.  Can likely DC in a.m.  Consultants:    Procedures:    Antimicrobials:  Anti-infectives (From admission, onward)    None       Data Reviewed: I have personally reviewed following labs and imaging studies CBC: Recent Labs  Lab 03/20/24 0106 03/20/24 0504 03/21/24 0501  WBC 8.8 9.8 7.7  NEUTROABS 5.1  --   --   HGB 9.8* 12.4 12.3  HCT 30.2* 38.2 38.4  MCV 87.8 87.0 89.5  PLT 462* 449* 518*   Basic Metabolic Panel: Recent Labs  Lab 03/20/24 0106 03/20/24 0504 03/20/24 1513 03/21/24 0501  NA 138 138 138 138  K 4.3 5.1 4.9 4.9  CL 104 104 103 103  CO2 21* 18* 23 24  GLUCOSE 102* 82 90 106*  BUN 49* 45* 29* 19  CREATININE 4.61* 3.74* 1.99* 1.39*  CALCIUM  8.7* 8.8* 9.3 9.2  MG  --  2.5*  --  2.2  PHOS  --  3.1  --  3.1   GFR: Estimated Creatinine Clearance: 52.6 mL/min (A) (by C-G formula based on SCr of 1.39 mg/dL (H)). Liver Function Tests: Recent Labs  Lab 03/20/24 0106  AST 18  ALT 12  ALKPHOS 83  BILITOT <0.2  PROT 6.4*  ALBUMIN 3.6   CBG: Recent Labs  Lab 03/20/24 1654 03/20/24 1945 03/20/24 2349 03/21/24 0728 03/21/24 1153  GLUCAP 117* 107* 127* 80 110*    Recent Results (from the past 240 hours)  Blood culture (single)     Status: None (Preliminary result)   Collection Time: 03/20/24  3:09 AM   Specimen: BLOOD  Result Value Ref Range Status   Specimen Description BLOOD LEFT HAND  Final   Special Requests   Final    BOTTLES DRAWN AEROBIC ONLY Blood Culture results may not be optimal due to an inadequate volume of blood received in culture bottles   Culture   Final    NO GROWTH 1 DAY Performed at [facility], [address] [place], [place] [address]     Report Status PENDING  Incomplete  MRSA Next Gen by PCR, Nasal     Status: None   Collection Time: 03/20/24  3:56 AM   Specimen: Nasal Mucosa; Nasal Swab  Result Value Ref Range Status   MRSA by PCR Next Gen NOT DETECTED NOT DETECTED Final    Comment: (NOTE) The GeneXpert MRSA Assay (FDA approved for NASAL specimens only), is one component of a comprehensive MRSA colonization surveillance program. It is not intended to diagnose MRSA infection nor to guide or monitor treatment for MRSA infections. Test performance is not FDA approved in patients less than 82 years old. Performed at [facility], [address] [place], [place] [address]      Radiology Studies: ECHOCARDIOGRAM COMPLETE Result Date: 03/20/2024    ECHOCARDIOGRAM REPORT  Patient Name:   JINGER Lowery [facility] Date of Exam: 03/20/2024 Medical Rec #:  [id number]     Height:       64.0 in Accession #:    [id number]    Weight:       244.9 lb Date of Birth:  Apr 11, 1966     BSA:          2.132 m Patient Age:    58 years      BP:           138/81 mmHg Patient Gender: F             HR:           93 bpm. Exam Location:  [facility] Procedure: 2D Echo, Cardiac Doppler and Color Doppler (Both Spectral and Color            Flow Doppler were utilized during procedure). Indications:     Syncope R55  History:         Patient has no prior history of Echocardiogram examinations.                  Signs/Symptoms:Syncope.  Sonographer:     Rosina Dunk Referring Phys:  [id number] BRITTON L RUST[place]CHESTER Diagnosing Phys: Marsa Dooms MD  Sonographer Comments: Technically difficult study due to poor echo windows. Image acquisition challenging due to respiratory motion. IMPRESSIONS  1. Left ventricular ejection fraction, by estimation, is 65 to 70%. The left ventricle has normal function. The left ventricle has no regional wall motion abnormalities. Left ventricular diastolic parameters were normal.  2. Right ventricular systolic function is normal. The  right ventricular size is normal.  3. The mitral valve is normal in structure. Trivial mitral valve regurgitation. No evidence of mitral stenosis.  4. The aortic valve is normal in structure. Aortic valve regurgitation is not visualized. No aortic stenosis is present.  5. The inferior vena cava is normal in size with greater than 50% respiratory variability, suggesting right atrial pressure of 3 mmHg. FINDINGS  Left Ventricle: Left ventricular ejection fraction, by estimation, is 65 to 70%. The left ventricle has normal function. The left ventricle has no regional wall motion abnormalities. Strain was performed and the global longitudinal strain is indeterminate. The left ventricular internal cavity size was normal in size. There is no left ventricular hypertrophy. Left ventricular diastolic parameters were normal. Right Ventricle: The right ventricular size is normal. No increase in right ventricular wall thickness. Right ventricular systolic function is normal. Left Atrium: Left atrial size was normal in size. Right Atrium: Right atrial size was normal in size. Pericardium: There is no evidence of pericardial effusion. Mitral Valve: The mitral valve is normal in structure. Trivial mitral valve regurgitation. No evidence of mitral valve stenosis. MV peak gradient, 4.3 mmHg. The mean mitral valve gradient is 2.0 mmHg. Tricuspid Valve: The tricuspid valve is normal in structure. Tricuspid valve regurgitation is trivial. No evidence of tricuspid stenosis. Aortic Valve: The aortic valve is normal in structure. Aortic valve regurgitation is not visualized. No aortic stenosis is present. Aortic valve mean gradient measures 6.0 mmHg. Aortic valve peak gradient measures 12.2 mmHg. Aortic valve area, by VTI measures 1.68 cm. Pulmonic Valve: The pulmonic valve was normal in structure. Pulmonic valve regurgitation is not visualized. No evidence of pulmonic stenosis. Aorta: The aortic root is normal in size and structure.  Venous: The inferior vena cava is normal in size with greater than 50% respiratory variability, suggesting right atrial pressure of 3 mmHg. IAS/Shunts: No  atrial level shunt detected by color flow Doppler. Additional Comments: 3D was performed not requiring image post processing on an independent workstation and was indeterminate.  LEFT VENTRICLE PLAX 2D LVIDd:         4.40 cm     Diastology LVIDs:         3.70 cm     LV e' medial:    7.83 cm/s LV PW:         0.90 cm     LV E/e' medial:  9.5 LV IVS:        0.80 cm     LV e' lateral:   9.46 cm/s LVOT diam:     1.70 cm     LV E/e' lateral: 7.8 LV SV:         42 LV SV Index:   20 LVOT Area:     2.27 cm  LV Volumes (MOD) LV vol d, MOD A2C: 34.2 ml LV vol d, MOD A4C: 38.0 ml LV vol s, MOD A2C: 13.3 ml LV vol s, MOD A4C: 7.5 ml LV SV MOD A2C:     20.9 ml LV SV MOD A4C:     38.0 ml LV SV MOD BP:      28.3 ml RIGHT VENTRICLE             IVC RV Basal diam:  3.80 cm     IVC diam: 1.60 cm RV Mid diam:    3.00 cm RV S prime:     15.70 cm/s TAPSE (M-mode): 1.9 cm LEFT ATRIUM           Index        RIGHT ATRIUM           Index LA diam:      3.20 cm 1.50 cm/m   RA Area:     15.10 cm LA Vol (A4C): 38.5 ml 18.06 ml/m  RA Volume:   43.00 ml  20.17 ml/m  AORTIC VALVE                     PULMONIC VALVE AV Area (Vmax):    1.60 cm      PV Vmax:        1.38 m/s AV Area (Vmean):   1.55 cm      PV Vmean:       97.700 cm/s AV Area (VTI):     1.68 cm      PV VTI:         0.212 m AV Vmax:           175.00 cm/s   PV Peak grad:   7.6 mmHg AV Vmean:          116.000 cm/s  PV Mean grad:   4.0 mmHg AV VTI:            0.251 m       RVOT Peak grad: 3 mmHg AV Peak Grad:      12.2 mmHg AV Mean Grad:      6.0 mmHg LVOT Vmax:         123.00 cm/s LVOT Vmean:        79.100 cm/s LVOT VTI:          0.186 m LVOT/AV VTI ratio: 0.74  AORTA Ao Root diam: 3.10 cm Ao Asc diam:  2.70 cm MITRAL VALVE MV Area (PHT): 4.15 cm    SHUNTS MV Area VTI:   2.26 cm    Systemic VTI:  0.19 m MV Peak grad:  4.3 mmHg     Systemic Diam: 1.70 cm MV Mean grad:  2.0 mmHg    Pulmonic VTI:  0.125 m MV Vmax:       1.04 m/s MV Vmean:      73.4 cm/s MV Decel Time: 183 msec MV E velocity: 74.10 cm/s MV A velocity: 66.70 cm/s MV E/A ratio:  1.11 Marsa Dooms MD Electronically signed by Marsa Dooms MD Signature Date/Time: 03/20/2024/4:51:56 PM    Final    CT Head Wo Contrast Result Date: 03/20/2024 EXAM: CT HEAD AND FACIAL BONES 03/20/2024 03:37:19 AM TECHNIQUE: CT of the head and facial bones was performed without the administration of intravenous contrast. Multiplanar reformatted images are provided for review. Automated exposure control, iterative reconstruction, and/or weight based adjustment of the mA/kV was utilized to reduce the radiation dose to as low as reasonably achievable. COMPARISON: None available. CLINICAL HISTORY: Facial trauma, blunt. FINDINGS: CT HEAD BRAIN AND VENTRICLES: No acute intracranial hemorrhage. No mass effect or midline shift. No extra-axial fluid collection. No evidence of acute infarct. No hydrocephalus. SKULL AND SCALP: No acute skull fracture. No scalp hematoma. CT FACIAL BONES FACIAL BONES: Right mandibular molar periapical lucencies. Caries are noted. No acute facial fracture. No mandibular dislocation. No suspicious bone lesion. ORBITS: No acute traumatic injury. SINUSES AND MASTOIDS: No acute abnormality. SOFT TISSUES: No acute abnormality. IMPRESSION: 1. No acute facial fracture. 2. No acute intracranial abnormality. 3. Right mandibular molar periapical lucencies and dental caries. Electronically signed by: Morgane Naveau MD 03/20/2024 03:49 AM EST RP Workstation: [id number]   CT MAXILLOFACIAL WO CONTRAST Result Date: 03/20/2024 EXAM: CT HEAD AND FACIAL BONES 03/20/2024 03:37:19 AM TECHNIQUE: CT of the head and facial bones was performed without the administration of intravenous contrast. Multiplanar reformatted images are provided for review. Automated exposure control, iterative  reconstruction, and/or weight based adjustment of the mA/kV was utilized to reduce the radiation dose to as low as reasonably achievable. COMPARISON: None available. CLINICAL HISTORY: Facial trauma, blunt. FINDINGS: CT HEAD BRAIN AND VENTRICLES: No acute intracranial hemorrhage. No mass effect or midline shift. No extra-axial fluid collection. No evidence of acute infarct. No hydrocephalus. SKULL AND SCALP: No acute skull fracture. No scalp hematoma. CT FACIAL BONES FACIAL BONES: Right mandibular molar periapical lucencies. Caries are noted. No acute facial fracture. No mandibular dislocation. No suspicious bone lesion. ORBITS: No acute traumatic injury. SINUSES AND MASTOIDS: No acute abnormality. SOFT TISSUES: No acute abnormality. IMPRESSION: 1. No acute facial fracture. 2. No acute intracranial abnormality. 3. Right mandibular molar periapical lucencies and dental caries. Electronically signed by: Morgane Naveau MD 03/20/2024 03:49 AM EST RP Workstation: [id number]   CT CERVICAL SPINE WO CONTRAST Result Date: 03/20/2024 EXAM: CT CERVICAL SPINE WITHOUT CONTRAST 03/20/2024 03:37:19 AM TECHNIQUE: CT of the cervical spine was performed without the administration of intravenous contrast. Multiplanar reformatted images are provided for review. Automated exposure control, iterative reconstruction, and/or weight based adjustment of the mA/kV was utilized to reduce the radiation dose to as low as reasonably achievable. COMPARISON: None available. CLINICAL HISTORY: Polytrauma, blunt. FINDINGS: CERVICAL SPINE: BONES AND ALIGNMENT: No acute fracture or traumatic malalignment. DEGENERATIVE CHANGES: ACDF C5-C7d. No CT findings suggest surgical hardware complication. SOFT TISSUES: No prevertebral soft tissue swelling. IMPRESSION: 1. No acute abnormality of the cervical spine. 2. No CT evidence of surgical hardware complication. Electronically signed by: Morgane Naveau MD 03/20/2024 03:45 AM EST RP Workstation: [id number]    CT ABDOMEN PELVIS WO CONTRAST Result Date: 03/20/2024 EXAM:  CT ABDOMEN AND PELVIS WITHOUT CONTRAST 03/20/2024 03:37:19 AM TECHNIQUE: CT of the abdomen and pelvis was performed without the administration of intravenous contrast. Multiplanar reformatted images are provided for review. Automated exposure control, iterative reconstruction, and/or weight-based adjustment of the mA/kV was utilized to reduce the radiation dose to as low as reasonably achievable. COMPARISON: None available. CLINICAL HISTORY: Kidney failure, acute. FINDINGS: LOWER CHEST: Similar appearing tiny hiatal hernia. LIVER: The liver is unremarkable. GALLBLADDER AND BILE DUCTS: Gallbladder is unremarkable. No biliary ductal dilatation. SPLEEN: No acute abnormality. PANCREAS: Diffusely atrophic pancreas. No focal lesion. Otherwise normal pancreatic contour. No surrounding inflammatory changes. No main pancreatic ductal dilatation. ADRENAL GLANDS: No acute abnormality. KIDNEYS, URETERS AND BLADDER: No stones in the kidneys or ureters. No hydronephrosis. No perinephric or periureteral stranding. Urinary bladder is unremarkable. GI AND BOWEL: Stomach demonstrates no acute abnormality. No small or large bowel thickening or dilatation. There is no bowel obstruction. The appendix is not definitely identified with no inflammatory changes in the right lower quadrant to suggest acute appendicitis. PERITONEUM AND RETROPERITONEUM: No ascites. No free air. VASCULATURE: Aorta is normal in caliber. Mild atherosclerotic plaque. LYMPH NODES: No lymphadenopathy. REPRODUCTIVE ORGANS: The uterus is unremarkable. No adnexal mass. BONES AND SOFT TISSUES: No acute osseous abnormality. No acute fracture. No focal soft tissue abnormality. IMPRESSION: 1. No acute findings in the abdomen or pelvis with limited evaluation of this noncontrast study . Electronically signed by: Morgane Naveau MD 03/20/2024 03:43 AM EST RP Workstation: [id number]   DG Chest Port 1  View Result Date: 03/20/2024 EXAM: 1 VIEW(S) XRAY OF THE CHEST 03/20/2024 01:35:00 AM COMPARISON: None available. CLINICAL HISTORY: Questionable sepsis - evaluate for abnormality FINDINGS: LUNGS AND PLEURA: No focal pulmonary opacity. No pleural effusion. No pneumothorax. HEART AND MEDIASTINUM: No acute abnormality of the cardiac and mediastinal silhouettes. BONES AND SOFT TISSUES: No acute osseous abnormality. IMPRESSION: 1. No acute process. Electronically signed by: Franky Crease MD 03/20/2024 01:36 AM EST RP Workstation: [id number]    Scheduled Meds:  Chlorhexidine  Gluconate Cloth  6 each Topical Daily   pantoprazole  (PROTONIX ) IV  40 mg Intravenous Q24H   Continuous Infusions:   LOS: 1 day  MDM: Patient is high risk for one or more organ failure.  They necessitate ongoing hospitalization for continued IV therapies and subsequent lab monitoring. Total time spent interpreting labs and vitals, reviewing the medical record, coordinating care amongst consultants and care team members, directly assessing and discussing care with the patient and/or family: 55 min  Peggyann Zwiefelhofer, DO [facility]  To contact the attending physician between 7A-7P please use Epic Chat. To contact the covering physician during after hours 7P-7A, please review Amion.  03/21/2024, 1:07 PM   *This document has been created with the assistance of dictation software. Please excuse typographical errors. *

## 2024-03-21 NOTE — Discharge Instructions (Signed)
 Some PCP options in [place] area- not a comprehensive list  [facility]- [phone] [facility]- [phone] [facility]- [phone] [facility]- [phone] [facility]- [phone] Nichole Molly- [phone]  or [facility] Physician Referral Line [phone]

## 2024-03-21 NOTE — Progress Notes (Signed)
 Patient admitted with syncope and collapse, maybe medication related. No PCP on record, list added to AVS. No TOC needs at this time. Please outreach if needs are identified.

## 2024-03-22 DIAGNOSIS — R579 Shock, unspecified: Secondary | ICD-10-CM

## 2024-03-22 DIAGNOSIS — E119 Type 2 diabetes mellitus without complications: Secondary | ICD-10-CM

## 2024-03-22 LAB — COMPREHENSIVE METABOLIC PANEL WITH GFR
ALT: 28 U/L (ref 0–44)
AST: 33 U/L (ref 15–41)
Albumin: 4.1 g/dL (ref 3.5–5.0)
Alkaline Phosphatase: 101 U/L (ref 38–126)
Anion gap: 12 (ref 5–15)
BUN: 14 mg/dL (ref 6–20)
CO2: 26 mmol/L (ref 22–32)
Calcium: 9.8 mg/dL (ref 8.9–10.3)
Chloride: 98 mmol/L (ref 98–111)
Creatinine, Ser: 1.3 mg/dL — ABNORMAL HIGH (ref 0.44–1.00)
GFR, Estimated: 47 mL/min — ABNORMAL LOW (ref >60)
Glucose, Bld: 116 mg/dL — ABNORMAL HIGH (ref 70–99)
Potassium: 4.7 mmol/L (ref 3.5–5.1)
Sodium: 137 mmol/L (ref 135–145)
Total Bilirubin: 0.3 mg/dL (ref 0.0–1.2)
Total Protein: 7.8 g/dL (ref 6.5–8.1)

## 2024-03-22 LAB — GLUCOSE, CAPILLARY
Glucose-Capillary: 119 mg/dL — ABNORMAL HIGH (ref 70–99)
Glucose-Capillary: 140 mg/dL — ABNORMAL HIGH (ref 70–99)

## 2024-03-22 NOTE — Discharge Summary (Signed)
 Physician Discharge Summary   Patient: Kelly Long MRN: [id number] DOB: 1965/06/09  Admit date:     03/20/2024  Discharge date: 03/22/2024  Discharge Physician: AIDA CHO   PCP: System, Provider Not In   Recommendations at discharge:   Follow-up with PCP in 1 week  Discharge Diagnoses: Principal Problem:   Shock circulatory (HCC) Active Problems:   AKI (acute kidney injury)   Syncope and collapse   Type 2 diabetes mellitus (HCC)  Resolved Problems:   * No resolved hospital problems. *  Hospital Course:  Kelly Long is a 58 year old female with history of thrombocytosis, hypertension, migraine headaches, and iron deficiency who presented on after a syncopal episode at home.  She was recently started on olmesartan for hypertension.  She said that she has been having diarrhea since then.  Diarrhea has been going on for about a week. Reportedly, when EMS picked her up, she was hypotensive with systolic blood pressure in the 50s.  She was given normal saline bolus and route to the ED.  She was admitted to the ICU for hypovolemic shock and required IV fluids and IV vasopressors.   Assessment and Plan:   Hypovolemic shock, refractory hypotension: Resolved.  She was treated with IV fluids and IV vasopressors (Levophed  infusion).   S/p syncope: Likely due to hypotension.   AKI on CKD stage IIIa: Improved.  Creatinine down from 4.61-1.30. She has been advised to discontinue indomethacin at discharge.   Diarrhea: Resolved   Comorbidities include hypertension, migraine headaches, type II DM on Mounjaro, class III obesity   Her condition has improved and she is deemed stable for discharge to home today.       Consultants: Intensivist Procedures performed: None Disposition: Home Diet recommendation:  Discharge Diet Orders (From admission, onward)     Start     Ordered   03/22/24 0000  Diet - low sodium heart healthy        03/22/24 1127   03/22/24 0000  Diet  Carb Modified        03/22/24 1127           Cardiac and Carb modified diet DISCHARGE MEDICATION: Allergies as of 03/22/2024       Reactions   Olmesartan Diarrhea   Severe diarrhea and AKI causing hypovolemic shock   Latex Swelling   Shellfish Allergy Swelling        Medication List     STOP taking these medications    indomethacin 75 MG CR capsule Commonly known as: INDOCIN SR       TAKE these medications    fexofenadine 180 MG tablet Commonly known as: ALLEGRA Take 180 mg by mouth daily as needed for allergies or rhinitis.   metoCLOPramide 10 MG tablet Commonly known as: REGLAN Take 10 mg by mouth daily as needed for nausea.   Mounjaro 5 MG/0.5ML Pen Generic drug: tirzepatide Inject 5 mg into the skin once a week.   rizatriptan 10 MG disintegrating tablet Commonly known as: MAXALT-MLT Take 10 mg by mouth as needed for migraine. May repeat in 2 hours if needed   rosuvastatin  5 MG tablet Commonly known as: CRESTOR  Take 5 mg by mouth daily.   tiZANidine 4 MG tablet Commonly known as: ZANAFLEX Take 4 mg by mouth 2 (two) times daily as needed for muscle spasms.        Follow-up Information     [facility] Follow up.   Why: Patient to make own appointment for follow up with  PCP               Discharge Exam: Filed Weights   03/20/24 2345 03/21/24 0406 03/22/24 0500  Weight: 109.1 kg 109 kg 109.7 kg    GEN: NAD SKIN: Warm and dry EYES: No pallor or icterus ENT: MMM CV: RRR PULM: CTA B ABD: soft, obese, NT, +BS CNS: AAO x 3, non focal EXT: No edema or tenderness   Condition at discharge: good  The results of significant diagnostics from this hospitalization (including imaging, microbiology, ancillary and laboratory) are listed below for reference.   Imaging Studies: ECHOCARDIOGRAM COMPLETE Result Date: 03/20/2024    ECHOCARDIOGRAM REPORT   Patient Name:   Kelly Long Date of Exam: 03/20/2024 Medical Rec #:  [id number]      Height:       64.0 in Accession #:    [id number]    Weight:       244.9 lb Date of Birth:  [birth year]     BSA:          2.132 m Patient Age:    58 years      BP:           138/81 mmHg Patient Gender: F             HR:           93 bpm. Exam Location:  [facility] Procedure: 2D Echo, Cardiac Doppler and Color Doppler (Both Spectral and Color            Flow Doppler were utilized during procedure). Indications:     Syncope R55  History:         Patient has no prior history of Echocardiogram examinations.                  Signs/Symptoms:Syncope.  Sonographer:     Rosina Dunk Referring Phys:  [id number] BRITTON L R[place]CHESTER Diagnosing Phys: Marsa Dooms MD  Sonographer Comments: Technically difficult study due to poor echo windows. Image acquisition challenging due to respiratory motion. IMPRESSIONS  1. Left ventricular ejection fraction, by estimation, is 65 to 70%. The left ventricle has normal function. The left ventricle has no regional wall motion abnormalities. Left ventricular diastolic parameters were normal.  2. Right ventricular systolic function is normal. The right ventricular size is normal.  3. The mitral valve is normal in structure. Trivial mitral valve regurgitation. No evidence of mitral stenosis.  4. The aortic valve is normal in structure. Aortic valve regurgitation is not visualized. No aortic stenosis is present.  5. The inferior vena cava is normal in size with greater than 50% respiratory variability, suggesting right atrial pressure of 3 mmHg. FINDINGS  Left Ventricle: Left ventricular ejection fraction, by estimation, is 65 to 70%. The left ventricle has normal function. The left ventricle has no regional wall motion abnormalities. Strain was performed and the global longitudinal strain is indeterminate. The left ventricular internal cavity size was normal in size. There is no left ventricular hypertrophy. Left ventricular diastolic parameters were normal. Right Ventricle: The right  ventricular size is normal. No increase in right ventricular wall thickness. Right ventricular systolic function is normal. Left Atrium: Left atrial size was normal in size. Right Atrium: Right atrial size was normal in size. Pericardium: There is no evidence of pericardial effusion. Mitral Valve: The mitral valve is normal in structure. Trivial mitral valve regurgitation. No evidence of mitral valve stenosis. MV peak gradient, 4.3 mmHg. The mean mitral valve gradient is 2.0 mmHg. Tricuspid  Valve: The tricuspid valve is normal in structure. Tricuspid valve regurgitation is trivial. No evidence of tricuspid stenosis. Aortic Valve: The aortic valve is normal in structure. Aortic valve regurgitation is not visualized. No aortic stenosis is present. Aortic valve mean gradient measures 6.0 mmHg. Aortic valve peak gradient measures 12.2 mmHg. Aortic valve area, by VTI measures 1.68 cm. Pulmonic Valve: The pulmonic valve was normal in structure. Pulmonic valve regurgitation is not visualized. No evidence of pulmonic stenosis. Aorta: The aortic root is normal in size and structure. Venous: The inferior vena cava is normal in size with greater than 50% respiratory variability, suggesting right atrial pressure of 3 mmHg. IAS/Shunts: No atrial level shunt detected by color flow Doppler. Additional Comments: 3D was performed not requiring image post processing on an independent workstation and was indeterminate.  LEFT VENTRICLE PLAX 2D LVIDd:         4.40 cm     Diastology LVIDs:         3.70 cm     LV e' medial:    7.83 cm/s LV PW:         0.90 cm     LV E/e' medial:  9.5 LV IVS:        0.80 cm     LV e' lateral:   9.46 cm/s LVOT diam:     1.70 cm     LV E/e' lateral: 7.8 LV SV:         42 LV SV Index:   20 LVOT Area:     2.27 cm  LV Volumes (MOD) LV vol d, MOD A2C: 34.2 ml LV vol d, MOD A4C: 38.0 ml LV vol s, MOD A2C: 13.3 ml LV vol s, MOD A4C: 7.5 ml LV SV MOD A2C:     20.9 ml LV SV MOD A4C:     38.0 ml LV SV MOD BP:       28.3 ml RIGHT VENTRICLE             IVC RV Basal diam:  3.80 cm     IVC diam: 1.60 cm RV Mid diam:    3.00 cm RV S prime:     15.70 cm/s TAPSE (M-mode): 1.9 cm LEFT ATRIUM           Index        RIGHT ATRIUM           Index LA diam:      3.20 cm 1.50 cm/m   RA Area:     15.10 cm LA Vol (A4C): 38.5 ml 18.06 ml/m  RA Volume:   43.00 ml  20.17 ml/m  AORTIC VALVE                     PULMONIC VALVE AV Area (Vmax):    1.60 cm      PV Vmax:        1.38 m/s AV Area (Vmean):   1.55 cm      PV Vmean:       97.700 cm/s AV Area (VTI):     1.68 cm      PV VTI:         0.212 m AV Vmax:           175.00 cm/s   PV Peak grad:   7.6 mmHg AV Vmean:          116.000 cm/s  PV Mean grad:   4.0 mmHg AV VTI:  0.251 m       RVOT Peak grad: 3 mmHg AV Peak Grad:      12.2 mmHg AV Mean Grad:      6.0 mmHg LVOT Vmax:         123.00 cm/s LVOT Vmean:        79.100 cm/s LVOT VTI:          0.186 m LVOT/AV VTI ratio: 0.74  AORTA Ao Root diam: 3.10 cm Ao Asc diam:  2.70 cm MITRAL VALVE MV Area (PHT): 4.15 cm    SHUNTS MV Area VTI:   2.26 cm    Systemic VTI:  0.19 m MV Peak grad:  4.3 mmHg    Systemic Diam: 1.70 cm MV Mean grad:  2.0 mmHg    Pulmonic VTI:  0.125 m MV Vmax:       1.04 m/s MV Vmean:      73.4 cm/s MV Decel Time: 183 msec MV E velocity: 74.10 cm/s MV A velocity: 66.70 cm/s MV E/A ratio:  1.11 Marsa Dooms MD Electronically signed by Marsa Dooms MD Signature Date/Time: 03/20/2024/4:51:56 PM    Final    CT Head Wo Contrast Result Date: 03/20/2024 EXAM: CT HEAD AND FACIAL BONES 03/20/2024 03:37:19 AM TECHNIQUE: CT of the head and facial bones was performed without the administration of intravenous contrast. Multiplanar reformatted images are provided for review. Automated exposure control, iterative reconstruction, and/or weight based adjustment of the mA/kV was utilized to reduce the radiation dose to as low as reasonably achievable. COMPARISON: None available. CLINICAL HISTORY: Facial trauma, blunt.  FINDINGS: CT HEAD BRAIN AND VENTRICLES: No acute intracranial hemorrhage. No mass effect or midline shift. No extra-axial fluid collection. No evidence of acute infarct. No hydrocephalus. SKULL AND SCALP: No acute skull fracture. No scalp hematoma. CT FACIAL BONES FACIAL BONES: Right mandibular molar periapical lucencies. Caries are noted. No acute facial fracture. No mandibular dislocation. No suspicious bone lesion. ORBITS: No acute traumatic injury. SINUSES AND MASTOIDS: No acute abnormality. SOFT TISSUES: No acute abnormality. IMPRESSION: 1. No acute facial fracture. 2. No acute intracranial abnormality. 3. Right mandibular molar periapical lucencies and dental caries. Electronically signed by: Morgane Naveau MD 03/20/2024 03:49 AM EST RP Workstation: [id number]   CT MAXILLOFACIAL WO CONTRAST Result Date: 03/20/2024 EXAM: CT HEAD AND FACIAL BONES 03/20/2024 03:37:19 AM TECHNIQUE: CT of the head and facial bones was performed without the administration of intravenous contrast. Multiplanar reformatted images are provided for review. Automated exposure control, iterative reconstruction, and/or weight based adjustment of the mA/kV was utilized to reduce the radiation dose to as low as reasonably achievable. COMPARISON: None available. CLINICAL HISTORY: Facial trauma, blunt. FINDINGS: CT HEAD BRAIN AND VENTRICLES: No acute intracranial hemorrhage. No mass effect or midline shift. No extra-axial fluid collection. No evidence of acute infarct. No hydrocephalus. SKULL AND SCALP: No acute skull fracture. No scalp hematoma. CT FACIAL BONES FACIAL BONES: Right mandibular molar periapical lucencies. Caries are noted. No acute facial fracture. No mandibular dislocation. No suspicious bone lesion. ORBITS: No acute traumatic injury. SINUSES AND MASTOIDS: No acute abnormality. SOFT TISSUES: No acute abnormality. IMPRESSION: 1. No acute facial fracture. 2. No acute intracranial abnormality. 3. Right mandibular molar  periapical lucencies and dental caries. Electronically signed by: Morgane Naveau MD 03/20/2024 03:49 AM EST RP Workstation: [id number]   CT CERVICAL SPINE WO CONTRAST Result Date: 03/20/2024 EXAM: CT CERVICAL SPINE WITHOUT CONTRAST 03/20/2024 03:37:19 AM TECHNIQUE: CT of the cervical spine was performed without the administration of intravenous contrast. Multiplanar  reformatted images are provided for review. Automated exposure control, iterative reconstruction, and/or weight based adjustment of the mA/kV was utilized to reduce the radiation dose to as low as reasonably achievable. COMPARISON: None available. CLINICAL HISTORY: Polytrauma, blunt. FINDINGS: CERVICAL SPINE: BONES AND ALIGNMENT: No acute fracture or traumatic malalignment. DEGENERATIVE CHANGES: ACDF C5-C7d. No CT findings suggest surgical hardware complication. SOFT TISSUES: No prevertebral soft tissue swelling. IMPRESSION: 1. No acute abnormality of the cervical spine. 2. No CT evidence of surgical hardware complication. Electronically signed by: Morgane Naveau MD 03/20/2024 03:45 AM EST RP Workstation: [id number]   CT ABDOMEN PELVIS WO CONTRAST Result Date: 03/20/2024 EXAM: CT ABDOMEN AND PELVIS WITHOUT CONTRAST 03/20/2024 03:37:19 AM TECHNIQUE: CT of the abdomen and pelvis was performed without the administration of intravenous contrast. Multiplanar reformatted images are provided for review. Automated exposure control, iterative reconstruction, and/or weight-based adjustment of the mA/kV was utilized to reduce the radiation dose to as low as reasonably achievable. COMPARISON: None available. CLINICAL HISTORY: Kidney failure, acute. FINDINGS: LOWER CHEST: Similar appearing tiny hiatal hernia. LIVER: The liver is unremarkable. GALLBLADDER AND BILE DUCTS: Gallbladder is unremarkable. No biliary ductal dilatation. SPLEEN: No acute abnormality. PANCREAS: Diffusely atrophic pancreas. No focal lesion. Otherwise normal pancreatic contour. No  surrounding inflammatory changes. No main pancreatic ductal dilatation. ADRENAL GLANDS: No acute abnormality. KIDNEYS, URETERS AND BLADDER: No stones in the kidneys or ureters. No hydronephrosis. No perinephric or periureteral stranding. Urinary bladder is unremarkable. GI AND BOWEL: Stomach demonstrates no acute abnormality. No small or large bowel thickening or dilatation. There is no bowel obstruction. The appendix is not definitely identified with no inflammatory changes in the right lower quadrant to suggest acute appendicitis. PERITONEUM AND RETROPERITONEUM: No ascites. No free air. VASCULATURE: Aorta is normal in caliber. Mild atherosclerotic plaque. LYMPH NODES: No lymphadenopathy. REPRODUCTIVE ORGANS: The uterus is unremarkable. No adnexal mass. BONES AND SOFT TISSUES: No acute osseous abnormality. No acute fracture. No focal soft tissue abnormality. IMPRESSION: 1. No acute findings in the abdomen or pelvis with limited evaluation of this noncontrast study . Electronically signed by: Morgane Naveau MD 03/20/2024 03:43 AM EST RP Workstation: [id number]   DG Chest Port 1 View Result Date: 03/20/2024 EXAM: 1 VIEW(S) XRAY OF THE CHEST 03/20/2024 01:35:00 AM COMPARISON: None available. CLINICAL HISTORY: Questionable sepsis - evaluate for abnormality FINDINGS: LUNGS AND PLEURA: No focal pulmonary opacity. No pleural effusion. No pneumothorax. HEART AND MEDIASTINUM: No acute abnormality of the cardiac and mediastinal silhouettes. BONES AND SOFT TISSUES: No acute osseous abnormality. IMPRESSION: 1. No acute process. Electronically signed by: Franky Crease MD 03/20/2024 01:36 AM EST RP Workstation: [id number]    Microbiology: Results for orders placed or performed during the hospital encounter of 03/20/24  Blood culture (single)     Status: None (Preliminary result)   Collection Time: 03/20/24  3:09 AM   Specimen: BLOOD  Result Value Ref Range Status   Specimen Description BLOOD LEFT HAND  Final   Special  Requests   Final    BOTTLES DRAWN AEROBIC ONLY Blood Culture results may not be optimal due to an inadequate volume of blood received in culture bottles   Culture   Final    NO GROWTH 2 DAYS Performed at [facility], [address], [place], [place] [address]    Report Status PENDING  Incomplete  MRSA Next Gen by PCR, Nasal     Status: None   Collection Time: 03/20/24  3:56 AM   Specimen: Nasal Mucosa; Nasal Swab  Result Value Ref  Range Status   MRSA by PCR Next Gen NOT DETECTED NOT DETECTED Final    Comment: (NOTE) The GeneXpert MRSA Assay (FDA approved for NASAL specimens only), is one component of a comprehensive MRSA colonization surveillance program. It is not intended to diagnose MRSA infection nor to guide or monitor treatment for MRSA infections. Test performance is not FDA approved in patients less than 35 years old. Performed at [facility], [address] [place], [place] [address]     Labs: CBC: Recent Labs  Lab 03/20/24 0106 03/20/24 0504 03/21/24 0501  WBC 8.8 9.8 7.7  NEUTROABS 5.1  --   --   HGB 9.8* 12.4 12.3  HCT 30.2* 38.2 38.4  MCV 87.8 87.0 89.5  PLT 462* 449* 518*   Basic Metabolic Panel: Recent Labs  Lab 03/20/24 0106 03/20/24 0504 03/20/24 1513 03/21/24 0501 03/22/24 0357  NA 138 138 138 138 137  K 4.3 5.1 4.9 4.9 4.7  CL 104 104 103 103 98  CO2 21* 18* 23 24 26   GLUCOSE 102* 82 90 106* 116*  BUN 49* 45* 29* 19 14  CREATININE 4.61* 3.74* 1.99* 1.39* 1.30*  CALCIUM  8.7* 8.8* 9.3 9.2 9.8  MG  --  2.5*  --  2.2  --   PHOS  --  3.1  --  3.1  --    Liver Function Tests: Recent Labs  Lab 03/20/24 0106 03/22/24 0357  AST 18 33  ALT 12 28  ALKPHOS 83 101  BILITOT <0.2 0.3  PROT 6.4* 7.8  ALBUMIN 3.6 4.1   CBG: Recent Labs  Lab 03/21/24 1153 03/21/24 1636 03/21/24 2120 03/22/24 0011 03/22/24 0438  GLUCAP 110* 95 121* 140* 119*    Discharge time spent: greater than 30 minutes.  Signed: AIDA CHO, MD [facility] 03/22/2024

## 2024-03-22 NOTE — Evaluation (Signed)
 Occupational Therapy Evaluation Patient Details Name: Kelly Long MRN: [id number] DOB: 05-12-1965 Today's Date: 03/22/2024   History of Present Illness   Pt is a 58 year old female who presented after a syncopal episode at home and frequent diarrhea. She was initially admitted to the ICU for pressor support, and treated for circulatory shock, AKI, and syncope. PMH of thrombocytosis, hypertension, migraine headaches, and iron deficiency     Clinical Impressions Pt was seen for OT evaluation this date. PTA, pt resides at home with her husband and works full time, does not drive. Independent without AD use. Pt presents with deficits in strength, balance and pain management, affecting safe and optimal ADL completion. Pt currently requires supervision for bed mobility tasks and CGA for transfers and ambulation during session. Min A for UB dressing and Max A for LB dressing d/t moderate to severe RUQ pain described as cramping limiting pt's tolerance for activity. She has mild dizziness upon initial standing that subsides with time.  Nurse notified of pain and nurse to notify MD. Pt would benefit from skilled OT services to address noted impairments and functional limitations to maximize safety and independence while minimizing future risk of falls, injury, and readmission. Do anticipate the need for follow up OT services upon acute hospital DC and recommend caregiver support upon return home with RW and [facility] for safety.      If plan is discharge home, recommend the following:   A little help with walking and/or transfers;A little help with bathing/dressing/bathroom;Assistance with cooking/housework;Assist for transportation;Help with stairs or ramp for entrance     Functional Status Assessment   Patient has had a recent decline in their functional status and demonstrates the ability to make significant improvements in function in a reasonable and predictable amount of time.     Equipment  Recommendations   BSC/3in1;Other (comment) (RW)     Recommendations for Other Services         Precautions/Restrictions   Precautions Precautions: Fall Recall of Precautions/Restrictions: Intact Restrictions Weight Bearing Restrictions Per Provider Order: No     Mobility Bed Mobility Overal bed mobility: Needs Assistance Bed Mobility: Supine to Sit     Supine to sit: Supervision     General bed mobility comments: increased time and effort    Transfers Overall transfer level: Needs assistance Equipment used: 2 person hand held assist Transfers: Sit to/from Stand Sit to Stand: Contact guard assist           General transfer comment: slow, cautious gait d/t pain and mild dizziness; HHA x2 for stability during ambulation      Balance Overall balance assessment: Needs assistance Sitting-balance support: Feet supported Sitting balance-Leahy Scale: Fair     Standing balance support: Bilateral upper extremity supported Standing balance-Leahy Scale: Fair                             ADL either performed or assessed with clinical judgement   ADL Overall ADL's : Needs assistance/impaired                 Upper Body Dressing : Supervision/safety;Sitting Upper Body Dressing Details (indicate cue type and reason): donn gown over back Lower Body Dressing: Maximal assistance;Sitting/lateral leans Lower Body Dressing Details (indicate cue type and reason): unable to donn socks d/t pain             Functional mobility during ADLs: Contact guard assist       Vision  Perception         Praxis         Pertinent Vitals/Pain Pain Assessment Pain Assessment: Faces Faces Pain Scale: Hurts whole lot Pain Location: RUQ Pain Descriptors / Indicators: Grimacing, Cramping, Guarding Pain Intervention(s): Monitored during session, Repositioned, Limited activity within patient's tolerance     Extremity/Trunk Assessment Upper  Extremity Assessment Upper Extremity Assessment: Overall WFL for tasks assessed   Lower Extremity Assessment Lower Extremity Assessment: Generalized weakness       Communication Communication Communication: No apparent difficulties   Cognition Arousal: Alert Behavior During Therapy: WFL for tasks assessed/performed                                 Following commands: Intact       Cueing  General Comments      RUQ pain/cramping reported-notified nurse who notified MD   Exercises Other Exercises Other Exercises: Edu on role of OT in acute setting.   Shoulder Instructions      Home Living Family/patient expects to be discharged to:: Private residence Living Arrangements: Spouse/significant other Available Help at Discharge: Family Type of Home: House Home Access: Stairs to enter Secretary/administrator of Steps: 4-5 Entrance Stairs-Rails: Left Home Layout: One level     Bathroom Shower/Tub: Producer, Television/film/video: Standard     Home Equipment: Cane - single point          Prior Functioning/Environment Prior Level of Function : Independent/Modified Independent;Working/employed             Mobility Comments: IND, only using SPC recently, 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, environmental, 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 ADLs Comments: IND with ADL/IADLs, working    OT Problem List: Decreased strength;Pain;Decreased activity tolerance;Impaired balance (sitting and/or standing)   OT Treatment/Interventions: Self-care/ADL training;Therapeutic exercise;Patient/family education;Therapeutic activities;Energy conservation;DME and/or AE instruction      OT Goals(Current goals can be found in the care plan section)   Acute Rehab OT Goals Patient Stated Goal: get better OT Goal Formulation: With patient Time For Goal Achievement: 04/05/24 Potential to Achieve Goals: Good ADL Goals Pt Will Perform Grooming: with modified independence;standing Pt Will Perform  Lower Body Dressing: with supervision;sit to/from stand;sitting/lateral leans Pt Will Transfer to Toilet: with supervision;with modified independence;ambulating   OT Frequency:  Min 2X/week    Co-evaluation              AM-PAC OT 6 Clicks Daily Activity     Outcome Measure Help from another person eating meals?: None Help from another person taking care of personal grooming?: A Little Help from another person toileting, which includes using toliet, bedpan, or urinal?: A Little Help from another person bathing (including washing, rinsing, drying)?: A Lot Help from another person to put on and taking off regular upper body clothing?: A Little Help from another person to put on and taking off regular lower body clothing?: A Lot 6 Click Score: 17   End of Session Nurse Communication: Mobility status  Activity Tolerance: Patient tolerated treatment well Patient left: in chair;with call bell/phone within reach  OT Visit Diagnosis: Other abnormalities of gait and mobility (R26.89);Muscle weakness (generalized) (M62.81);Pain Pain - Right/Left: Right                Time: 9199-9183 OT Time Calculation (min): 16 min Charges:  OT General Charges $OT Visit: 1 Visit OT Evaluation $OT Eval Low Complexity: 1 Low Pinkey Mcjunkin Chrismon, OTR/L  03/22/2024, 9:24 AM  Jacqulynn Shappell E Chrismon 03/22/2024, 9:21 AM

## 2024-03-22 NOTE — Evaluation (Signed)
 Physical Therapy Evaluation Patient Details Name: Leonetta Mcgivern MRN: [id number] DOB: [birth year] Today's Date: 03/22/2024  History of Present Illness  Pt is a 58 year old female who presented after a syncopal episode at home and frequent diarrhea. She was initially admitted to the ICU for pressor support, and treated for circulatory shock, AKI, and syncope. PMH of thrombocytosis, hypertension, migraine headaches, and iron deficiency  Clinical Impression  Patient admitted with the above. PTA, patient lives with husband and was independent with mobility typically, however has been using SPC recently due to balance. She works full time but husband does all driving. Evaluation limited by severe RUQ pain. Able to complete bed mobility with supervision. Stood at bedside with HHA and CGA. Ambulated short distance with HHAx2 (face to face) and CGA but limited due to pain in RUQ. HR up to 116 bpm with minimal mobility. Notified RN of RUQ pain, nausea, and acid reflux type symptoms. Patient will benefit from skilled PT services during acute stay to address listed deficits. Anticipate no PT follow up at discharge.     If plan is discharge home, recommend the following: A little help with walking and/or transfers;A little help with bathing/dressing/bathroom;Assistance with cooking/housework;Help with stairs or ramp for entrance   Can travel by private vehicle        Equipment Recommendations Rolling Britne Borelli (2 wheels);BSC/3in1  Recommendations for Other Services       Functional Status Assessment Patient has had a recent decline in their functional status and demonstrates the ability to make significant improvements in function in a reasonable and predictable amount of time.     Precautions / Restrictions Precautions Precautions: Fall Recall of Precautions/Restrictions: Intact Restrictions Weight Bearing Restrictions Per Provider Order: No      Mobility  Bed Mobility Overal bed mobility: Needs  Assistance Bed Mobility: Supine to Sit     Supine to sit: Supervision          Transfers Overall transfer level: Needs assistance Equipment used: 2 person hand held assist Transfers: Sit to/from Stand Sit to Stand: Contact guard assist                Ambulation/Gait Ambulation/Gait assistance: Contact guard assist Gait Distance (Feet): 25 Feet Assistive device: 2 person hand held assist Gait Pattern/deviations: Step-to pattern, Decreased stride length Gait velocity: decreased     General Gait Details: limited by pain in RUQ, CGA for safety. HR up to 116  Stairs            Wheelchair Mobility     Tilt Bed    Modified Rankin (Stroke Patients Only)       Balance Overall balance assessment: Mild deficits observed, not formally tested                                           Pertinent Vitals/Pain Pain Assessment Pain Assessment: Faces Faces Pain Scale: Hurts whole lot Pain Location: RUQ Pain Descriptors / Indicators: Grimacing, Cramping, Guarding Pain Intervention(s): Limited activity within patient's tolerance, Monitored during session, Repositioned    Home Living Family/patient expects to be discharged to:: Private residence Living Arrangements: Spouse/significant other Available Help at Discharge: Family Type of Home: House Home Access: Stairs to enter Entrance Stairs-Rails: Left Entrance Stairs-Number of Steps: 4-5   Home Layout: One level Home Equipment: Cane - single point      Prior Function Prior Level of Function :  Independent/Modified Independent;Working/employed             Mobility Comments: IND, only using SPC recently, 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, environmental, 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 ADLs Comments: IND with ADL/IADLs, working     Extremity/Trunk Assessment   Upper Extremity Assessment Upper Extremity Assessment: Defer to OT evaluation    Lower Extremity Assessment Lower Extremity Assessment: Generalized  weakness       Communication   Communication Communication: No apparent difficulties    Cognition Arousal: Alert Behavior During Therapy: WFL for tasks assessed/performed   PT - Cognitive impairments: No apparent impairments                         Following commands: Intact       Cueing       General Comments      Exercises     Assessment/Plan    PT Assessment Patient needs continued PT services  PT Problem List Decreased strength;Decreased activity tolerance;Decreased balance;Decreased mobility;Cardiopulmonary status limiting activity       PT Treatment Interventions DME instruction;Gait training;Functional mobility training;Therapeutic activities;Therapeutic exercise;Balance training;Stair training;Neuromuscular re-education    PT Goals (Current goals can be found in the Care Plan section)  Acute Rehab PT Goals Patient Stated Goal: to reduce pain PT Goal Formulation: With patient Time For Goal Achievement: 04/05/24 Potential to Achieve Goals: Good    Frequency Min 2X/week     Co-evaluation               AM-PAC PT 6 Clicks Mobility  Outcome Measure Help needed turning from your back to your side while in a flat bed without using bedrails?: A Little Help needed moving from lying on your back to sitting on the side of a flat bed without using bedrails?: A Little Help needed moving to and from a bed to a chair (including a wheelchair)?: A Little Help needed standing up from a chair using your arms (e.g., wheelchair or bedside chair)?: A Little Help needed to walk in hospital room?: A Little Help needed climbing 3-5 steps with a railing? : A Little 6 Click Score: 18    End of Session   Activity Tolerance: Patient limited by pain Patient left: in chair;with call bell/phone within reach Nurse Communication: Mobility status PT Visit Diagnosis: Unsteadiness on feet (R26.81);Muscle weakness (generalized) (M62.81)    Time: 9199-9183 PT  Time Calculation (min) (ACUTE ONLY): 16 min   Charges:   PT Evaluation $PT Eval Low Complexity: 1 Low   PT General Charges $$ ACUTE PT VISIT: 1 Visit         Maryanne Finder, PT, DPT Physical Therapist - [facility]  [facility]   Aleynah Rocchio A Florene Brill 03/22/2024, 8:47 AM

## 2024-03-22 NOTE — TOC Transition Note (Signed)
 Transition of Care [facility]) - Discharge Note   Patient Details  Name: Kelly Long MRN: [id number] Date of Birth: 01-10-1966  Transition of [facility] CM/SW Contact:  Dalia GORMAN Fuse, RN Phone Number: 03/22/2024, 12:19 PM   Clinical Narrative:     TOC met with the patient in the room. She lives at home with her husband and is independent with her ADLs. Her husband drives her to and from appointments.  She has DME cane at home. She uses [facility] on [address].  Therapy recs are for DME 3 in 1 BSC and RW. The patient was discharged before equipment was ordered. TOC obtained the order from the MD and sent the referral to [employer], [facility] accepted. The equipment will be delivered to the patient's home. No other TOC needs.         Patient Goals and CMS Choice            Discharge Placement                       Discharge Plan and Services Additional resources added to the After Visit Summary for                                       Social Drivers of Health (SDOH) Interventions SDOH Screenings   Food Insecurity: No Food Insecurity (03/20/2024)  Housing: Unknown (03/20/2024)  Transportation Needs: No Transportation Needs (03/20/2024)  Utilities: Not At Risk (03/20/2024)  Financial Resource Strain: Low Risk (04/27/2022)   Received from [facility]  Tobacco Use: Unknown (03/20/2024)     Readmission Risk Interventions     No data to display

## 2024-03-22 NOTE — Progress Notes (Signed)
 Narrative for 3 in 1 [facility]  The patient requires the use of a 3 in 1 BSC due to significant mobility and safety limitations. The patient is unable to ambulate safely to the bathroom because of advanced age, weakness, and fall risk. Transferring on and off a standard toilet presents a hazard, as the patient demonstrated decreased balance, endurance and strength.

## 2024-03-25 LAB — CULTURE, BLOOD (SINGLE): Culture: NO GROWTH
